# Patient Record
Sex: Female | Born: 1975 | Race: Black or African American | Hispanic: No | Marital: Married | Smoking: Never smoker
Health system: Southern US, Community
[De-identification: ages and names within clinical notes are randomized; demographics above are authoritative.]

## PROBLEM LIST (undated history)

## (undated) DIAGNOSIS — L0591 Pilonidal cyst without abscess: Secondary | ICD-10-CM

## (undated) DIAGNOSIS — M1990 Unspecified osteoarthritis, unspecified site: Secondary | ICD-10-CM

## (undated) DIAGNOSIS — R896 Abnormal cytological findings in specimens from other organs, systems and tissues: Secondary | ICD-10-CM

## (undated) DIAGNOSIS — R87629 Unspecified abnormal cytological findings in specimens from vagina: Secondary | ICD-10-CM

## (undated) DIAGNOSIS — O119 Pre-existing hypertension with pre-eclampsia, unspecified trimester: Secondary | ICD-10-CM

## (undated) DIAGNOSIS — B009 Herpesviral infection, unspecified: Secondary | ICD-10-CM

## (undated) DIAGNOSIS — O09299 Supervision of pregnancy with other poor reproductive or obstetric history, unspecified trimester: Secondary | ICD-10-CM

## (undated) DIAGNOSIS — O1503 Eclampsia in pregnancy, third trimester: Secondary | ICD-10-CM

## (undated) DIAGNOSIS — N841 Polyp of cervix uteri: Secondary | ICD-10-CM

## (undated) DIAGNOSIS — IMO0001 Reserved for inherently not codable concepts without codable children: Secondary | ICD-10-CM

## (undated) DIAGNOSIS — I10 Essential (primary) hypertension: Secondary | ICD-10-CM

## (undated) HISTORY — PX: WISDOM TOOTH EXTRACTION: SHX21

## (undated) HISTORY — DX: Polyp of cervix uteri: N84.1

## (undated) HISTORY — DX: Herpesviral infection, unspecified: B00.9

## (undated) HISTORY — PX: HIP SURGERY: SHX245

## (undated) HISTORY — DX: Reserved for inherently not codable concepts without codable children: IMO0001

## (undated) HISTORY — DX: Abnormal cytological findings in specimens from other organs, systems and tissues: R89.6

---

## 2002-08-01 ENCOUNTER — Other Ambulatory Visit: Admission: RE | Admit: 2002-08-01 | Discharge: 2002-08-01 | Payer: Self-pay | Admitting: Obstetrics and Gynecology

## 2003-01-06 ENCOUNTER — Emergency Department (HOSPITAL_COMMUNITY): Admission: EM | Admit: 2003-01-06 | Discharge: 2003-01-06 | Payer: Self-pay | Admitting: Emergency Medicine

## 2003-06-25 ENCOUNTER — Emergency Department (HOSPITAL_COMMUNITY): Admission: EM | Admit: 2003-06-25 | Discharge: 2003-06-25 | Payer: Self-pay | Admitting: Emergency Medicine

## 2004-03-07 ENCOUNTER — Other Ambulatory Visit: Admission: RE | Admit: 2004-03-07 | Discharge: 2004-03-07 | Payer: Self-pay | Admitting: Obstetrics and Gynecology

## 2004-04-02 ENCOUNTER — Ambulatory Visit: Payer: Self-pay | Admitting: Family Medicine

## 2004-04-03 ENCOUNTER — Ambulatory Visit: Payer: Self-pay | Admitting: *Deleted

## 2005-08-06 ENCOUNTER — Other Ambulatory Visit: Admission: RE | Admit: 2005-08-06 | Discharge: 2005-08-06 | Payer: Self-pay | Admitting: Obstetrics and Gynecology

## 2011-05-01 ENCOUNTER — Encounter (INDEPENDENT_AMBULATORY_CARE_PROVIDER_SITE_OTHER): Payer: Federal, State, Local not specified - PPO | Admitting: Obstetrics and Gynecology

## 2011-05-01 DIAGNOSIS — R87610 Atypical squamous cells of undetermined significance on cytologic smear of cervix (ASC-US): Secondary | ICD-10-CM

## 2012-02-16 DIAGNOSIS — B009 Herpesviral infection, unspecified: Secondary | ICD-10-CM | POA: Insufficient documentation

## 2012-02-16 DIAGNOSIS — N841 Polyp of cervix uteri: Secondary | ICD-10-CM | POA: Insufficient documentation

## 2012-02-16 DIAGNOSIS — IMO0001 Reserved for inherently not codable concepts without codable children: Secondary | ICD-10-CM | POA: Insufficient documentation

## 2012-02-19 ENCOUNTER — Ambulatory Visit (INDEPENDENT_AMBULATORY_CARE_PROVIDER_SITE_OTHER): Payer: Federal, State, Local not specified - PPO | Admitting: Obstetrics and Gynecology

## 2012-02-19 ENCOUNTER — Encounter: Payer: Self-pay | Admitting: Obstetrics and Gynecology

## 2012-02-19 VITALS — BP 134/80 | HR 82 | Wt 260.0 lb

## 2012-02-19 DIAGNOSIS — Z309 Encounter for contraceptive management, unspecified: Secondary | ICD-10-CM

## 2012-02-19 DIAGNOSIS — Z3046 Encounter for surveillance of implantable subdermal contraceptive: Secondary | ICD-10-CM

## 2012-02-19 DIAGNOSIS — IMO0001 Reserved for inherently not codable concepts without codable children: Secondary | ICD-10-CM

## 2012-02-19 DIAGNOSIS — Z30017 Encounter for initial prescription of implantable subdermal contraceptive: Secondary | ICD-10-CM

## 2012-02-19 LAB — POCT URINE PREGNANCY: Preg Test, Ur: NEGATIVE

## 2012-02-19 MED ORDER — ETONOGESTREL 68 MG ~~LOC~~ IMPL
68.0000 mg | DRUG_IMPLANT | Freq: Once | SUBCUTANEOUS | Status: AC
  Administered 2012-02-19: 68 mg via SUBCUTANEOUS

## 2012-02-19 NOTE — Addendum Note (Signed)
Addended byWinfred Leeds on: 02/19/2012 03:34 PM   Modules accepted: Orders

## 2012-02-19 NOTE — Progress Notes (Signed)
36 YO for Nexplanon removal and reinsertion.  Consent signed.  O:Right Arm:  Implant that was removed without difficulty-per protocol had been placed 3 cm above the medial elbow,  thus, Nexplanon placed in same space    without difficulty, incision closed with steri-strips and Benzoin and dressed with sterile band-aid and sterile 4 x 4 pressure bandage with Kling  UPT-negative  A:  Nexplanon Removal and reinsertion  P: Call [facility] [phone]:  -for temperature of 100.4 degrees Fahrenheit or more -pain not improved with over the counter pain medications (Ibuprofen, Advil, Aleve,     Tylenol or acetaminophen) -for excessive bleeding from insertion site -for excessive swelling redness or green drainage from your insertion site -for any other concerns -keep insertion site clean, dry and covered  for 24 hours -you may remove pressure bandage in 1-4 hours  RTO-1 week for follow up or prn  Matisyn Cabeza, PA-C

## 2012-02-19 NOTE — Patient Instructions (Signed)
Call [facility] [phone]:  -for temperature of 100.4 degrees Fahrenheit or more -pain not improved with over the counter pain medications (Ibuprofen, Advil, Aleve,     Tylenol or acetaminophen) -for excessive bleeding from insertion site -for excessive swelling redness or green drainage from your insertion site -for any other concerns -keep insertion site clean, dry and covered  for 24 hours -you may remove pressure bandage in 1-4 hours

## 2012-04-29 ENCOUNTER — Ambulatory Visit: Payer: Federal, State, Local not specified - PPO | Admitting: Obstetrics and Gynecology

## 2013-12-18 ENCOUNTER — Encounter: Payer: Self-pay | Admitting: Obstetrics and Gynecology

## 2015-07-29 DIAGNOSIS — R319 Hematuria, unspecified: Secondary | ICD-10-CM | POA: Insufficient documentation

## 2015-08-23 DIAGNOSIS — R319 Hematuria, unspecified: Secondary | ICD-10-CM | POA: Diagnosis not present

## 2015-09-04 DIAGNOSIS — K080 Exfoliation of teeth due to systemic causes: Secondary | ICD-10-CM | POA: Diagnosis not present

## 2015-09-09 DIAGNOSIS — R32 Unspecified urinary incontinence: Secondary | ICD-10-CM | POA: Diagnosis not present

## 2015-09-09 DIAGNOSIS — R3129 Other microscopic hematuria: Secondary | ICD-10-CM | POA: Diagnosis not present

## 2016-04-06 DIAGNOSIS — J309 Allergic rhinitis, unspecified: Secondary | ICD-10-CM | POA: Diagnosis not present

## 2016-04-06 DIAGNOSIS — J301 Allergic rhinitis due to pollen: Secondary | ICD-10-CM | POA: Diagnosis not present

## 2016-04-06 DIAGNOSIS — R21 Rash and other nonspecific skin eruption: Secondary | ICD-10-CM | POA: Diagnosis not present

## 2016-04-06 DIAGNOSIS — H1045 Other chronic allergic conjunctivitis: Secondary | ICD-10-CM | POA: Diagnosis not present

## 2016-04-08 DIAGNOSIS — J302 Other seasonal allergic rhinitis: Secondary | ICD-10-CM | POA: Insufficient documentation

## 2016-04-09 DIAGNOSIS — K080 Exfoliation of teeth due to systemic causes: Secondary | ICD-10-CM | POA: Diagnosis not present

## 2016-04-09 DIAGNOSIS — J301 Allergic rhinitis due to pollen: Secondary | ICD-10-CM | POA: Diagnosis not present

## 2016-04-10 DIAGNOSIS — L219 Seborrheic dermatitis, unspecified: Secondary | ICD-10-CM | POA: Diagnosis not present

## 2016-04-10 DIAGNOSIS — L308 Other specified dermatitis: Secondary | ICD-10-CM | POA: Diagnosis not present

## 2016-04-17 DIAGNOSIS — I10 Essential (primary) hypertension: Secondary | ICD-10-CM | POA: Diagnosis not present

## 2016-04-17 DIAGNOSIS — L309 Dermatitis, unspecified: Secondary | ICD-10-CM | POA: Insufficient documentation

## 2016-06-15 DIAGNOSIS — Z3040 Encounter for surveillance of contraceptives, unspecified: Secondary | ICD-10-CM | POA: Diagnosis not present

## 2016-06-18 DIAGNOSIS — Z3A01 Less than 8 weeks gestation of pregnancy: Secondary | ICD-10-CM | POA: Diagnosis not present

## 2016-06-18 DIAGNOSIS — O26859 Spotting complicating pregnancy, unspecified trimester: Secondary | ICD-10-CM | POA: Diagnosis not present

## 2016-06-18 DIAGNOSIS — O26851 Spotting complicating pregnancy, first trimester: Secondary | ICD-10-CM | POA: Diagnosis not present

## 2016-07-15 DIAGNOSIS — O161 Unspecified maternal hypertension, first trimester: Secondary | ICD-10-CM | POA: Diagnosis not present

## 2016-07-15 DIAGNOSIS — O208 Other hemorrhage in early pregnancy: Secondary | ICD-10-CM | POA: Diagnosis not present

## 2016-07-15 DIAGNOSIS — Z6841 Body Mass Index (BMI) 40.0 and over, adult: Secondary | ICD-10-CM | POA: Diagnosis not present

## 2016-07-15 DIAGNOSIS — Z3A08 8 weeks gestation of pregnancy: Secondary | ICD-10-CM | POA: Diagnosis not present

## 2016-07-24 DIAGNOSIS — Z3A10 10 weeks gestation of pregnancy: Secondary | ICD-10-CM | POA: Diagnosis not present

## 2016-07-24 DIAGNOSIS — O09521 Supervision of elderly multigravida, first trimester: Secondary | ICD-10-CM | POA: Diagnosis not present

## 2016-07-24 DIAGNOSIS — O161 Unspecified maternal hypertension, first trimester: Secondary | ICD-10-CM | POA: Diagnosis not present

## 2016-07-24 LAB — OB RESULTS CONSOLE ABO/RH: RH Type: POSITIVE

## 2016-07-24 LAB — OB RESULTS CONSOLE RUBELLA ANTIBODY, IGM: Rubella: IMMUNE

## 2016-07-24 LAB — OB RESULTS CONSOLE GC/CHLAMYDIA
Chlamydia: NEGATIVE
Gonorrhea: NEGATIVE

## 2016-07-24 LAB — OB RESULTS CONSOLE HIV ANTIBODY (ROUTINE TESTING): HIV: NONREACTIVE

## 2016-07-24 LAB — OB RESULTS CONSOLE HEPATITIS B SURFACE ANTIGEN: Hepatitis B Surface Ag: NEGATIVE

## 2016-07-24 LAB — OB RESULTS CONSOLE RPR: RPR: NONREACTIVE

## 2016-07-24 LAB — OB RESULTS CONSOLE ANTIBODY SCREEN: Antibody Screen: NEGATIVE

## 2016-07-31 DIAGNOSIS — Z113 Encounter for screening for infections with a predominantly sexual mode of transmission: Secondary | ICD-10-CM | POA: Diagnosis not present

## 2016-07-31 DIAGNOSIS — Z3689 Encounter for other specified antenatal screening: Secondary | ICD-10-CM | POA: Diagnosis not present

## 2016-07-31 DIAGNOSIS — R87610 Atypical squamous cells of undetermined significance on cytologic smear of cervix (ASC-US): Secondary | ICD-10-CM | POA: Diagnosis not present

## 2016-07-31 DIAGNOSIS — O09521 Supervision of elderly multigravida, first trimester: Secondary | ICD-10-CM | POA: Diagnosis not present

## 2016-07-31 DIAGNOSIS — Z3A11 11 weeks gestation of pregnancy: Secondary | ICD-10-CM | POA: Diagnosis not present

## 2016-07-31 DIAGNOSIS — Z36 Encounter for antenatal screening for chromosomal anomalies: Secondary | ICD-10-CM | POA: Diagnosis not present

## 2016-08-18 DIAGNOSIS — O09521 Supervision of elderly multigravida, first trimester: Secondary | ICD-10-CM | POA: Diagnosis not present

## 2016-08-18 DIAGNOSIS — Z3A13 13 weeks gestation of pregnancy: Secondary | ICD-10-CM | POA: Diagnosis not present

## 2016-09-04 DIAGNOSIS — O09522 Supervision of elderly multigravida, second trimester: Secondary | ICD-10-CM | POA: Diagnosis not present

## 2016-09-04 DIAGNOSIS — Z3A16 16 weeks gestation of pregnancy: Secondary | ICD-10-CM | POA: Diagnosis not present

## 2016-09-04 DIAGNOSIS — Z361 Encounter for antenatal screening for raised alphafetoprotein level: Secondary | ICD-10-CM | POA: Diagnosis not present

## 2016-09-28 DIAGNOSIS — O09522 Supervision of elderly multigravida, second trimester: Secondary | ICD-10-CM | POA: Diagnosis not present

## 2016-09-28 DIAGNOSIS — Z3A19 19 weeks gestation of pregnancy: Secondary | ICD-10-CM | POA: Diagnosis not present

## 2016-09-28 DIAGNOSIS — Z363 Encounter for antenatal screening for malformations: Secondary | ICD-10-CM | POA: Diagnosis not present

## 2016-10-23 DIAGNOSIS — Z362 Encounter for other antenatal screening follow-up: Secondary | ICD-10-CM | POA: Diagnosis not present

## 2016-10-23 DIAGNOSIS — O09522 Supervision of elderly multigravida, second trimester: Secondary | ICD-10-CM | POA: Diagnosis not present

## 2016-10-23 DIAGNOSIS — Z3A23 23 weeks gestation of pregnancy: Secondary | ICD-10-CM | POA: Diagnosis not present

## 2016-11-02 DIAGNOSIS — O09522 Supervision of elderly multigravida, second trimester: Secondary | ICD-10-CM | POA: Diagnosis not present

## 2016-11-02 DIAGNOSIS — L0591 Pilonidal cyst without abscess: Secondary | ICD-10-CM | POA: Diagnosis not present

## 2016-11-02 DIAGNOSIS — Z3A24 24 weeks gestation of pregnancy: Secondary | ICD-10-CM | POA: Diagnosis not present

## 2016-11-12 DIAGNOSIS — K080 Exfoliation of teeth due to systemic causes: Secondary | ICD-10-CM | POA: Diagnosis not present

## 2016-11-16 DIAGNOSIS — O283 Abnormal ultrasonic finding on antenatal screening of mother: Secondary | ICD-10-CM | POA: Diagnosis not present

## 2016-11-16 DIAGNOSIS — Z3A26 26 weeks gestation of pregnancy: Secondary | ICD-10-CM | POA: Diagnosis not present

## 2016-11-16 DIAGNOSIS — O10012 Pre-existing essential hypertension complicating pregnancy, second trimester: Secondary | ICD-10-CM | POA: Diagnosis not present

## 2016-11-23 DIAGNOSIS — O132 Gestational [pregnancy-induced] hypertension without significant proteinuria, second trimester: Secondary | ICD-10-CM | POA: Diagnosis not present

## 2016-11-23 DIAGNOSIS — Z3A27 27 weeks gestation of pregnancy: Secondary | ICD-10-CM | POA: Diagnosis not present

## 2016-11-23 DIAGNOSIS — Z3689 Encounter for other specified antenatal screening: Secondary | ICD-10-CM | POA: Diagnosis not present

## 2016-11-30 DIAGNOSIS — Z3689 Encounter for other specified antenatal screening: Secondary | ICD-10-CM | POA: Diagnosis not present

## 2016-11-30 DIAGNOSIS — Z3A28 28 weeks gestation of pregnancy: Secondary | ICD-10-CM | POA: Diagnosis not present

## 2016-11-30 DIAGNOSIS — O132 Gestational [pregnancy-induced] hypertension without significant proteinuria, second trimester: Secondary | ICD-10-CM | POA: Diagnosis not present

## 2016-11-30 DIAGNOSIS — O4443 Low lying placenta NOS or without hemorrhage, third trimester: Secondary | ICD-10-CM | POA: Diagnosis not present

## 2016-11-30 DIAGNOSIS — O10013 Pre-existing essential hypertension complicating pregnancy, third trimester: Secondary | ICD-10-CM | POA: Diagnosis not present

## 2016-12-14 DIAGNOSIS — Z23 Encounter for immunization: Secondary | ICD-10-CM | POA: Diagnosis not present

## 2016-12-14 DIAGNOSIS — Z3A30 30 weeks gestation of pregnancy: Secondary | ICD-10-CM | POA: Diagnosis not present

## 2016-12-14 DIAGNOSIS — O10013 Pre-existing essential hypertension complicating pregnancy, third trimester: Secondary | ICD-10-CM | POA: Diagnosis not present

## 2016-12-17 DIAGNOSIS — L0501 Pilonidal cyst with abscess: Secondary | ICD-10-CM | POA: Diagnosis not present

## 2016-12-28 DIAGNOSIS — O10013 Pre-existing essential hypertension complicating pregnancy, third trimester: Secondary | ICD-10-CM | POA: Diagnosis not present

## 2016-12-28 DIAGNOSIS — Z3A32 32 weeks gestation of pregnancy: Secondary | ICD-10-CM | POA: Diagnosis not present

## 2017-01-11 DIAGNOSIS — Z3A34 34 weeks gestation of pregnancy: Secondary | ICD-10-CM | POA: Diagnosis not present

## 2017-01-11 DIAGNOSIS — O10013 Pre-existing essential hypertension complicating pregnancy, third trimester: Secondary | ICD-10-CM | POA: Diagnosis not present

## 2017-01-14 ENCOUNTER — Other Ambulatory Visit: Payer: Self-pay | Admitting: Obstetrics and Gynecology

## 2017-01-19 DIAGNOSIS — Z3A35 35 weeks gestation of pregnancy: Secondary | ICD-10-CM | POA: Diagnosis not present

## 2017-01-19 DIAGNOSIS — Z3685 Encounter for antenatal screening for Streptococcus B: Secondary | ICD-10-CM | POA: Diagnosis not present

## 2017-01-19 DIAGNOSIS — O10013 Pre-existing essential hypertension complicating pregnancy, third trimester: Secondary | ICD-10-CM | POA: Diagnosis not present

## 2017-01-26 ENCOUNTER — Encounter (HOSPITAL_COMMUNITY): Payer: Self-pay

## 2017-01-26 ENCOUNTER — Other Ambulatory Visit: Payer: Self-pay

## 2017-01-26 ENCOUNTER — Inpatient Hospital Stay (HOSPITAL_COMMUNITY)
Admission: AD | Admit: 2017-01-26 | Discharge: 2017-01-26 | Disposition: A | Discharge status: Home / Self Care | Payer: Federal, State, Local not specified - PPO | Source: Ambulatory Visit | Attending: Obstetrics and Gynecology | Admitting: Obstetrics and Gynecology

## 2017-01-26 DIAGNOSIS — Z3A36 36 weeks gestation of pregnancy: Secondary | ICD-10-CM | POA: Diagnosis not present

## 2017-01-26 DIAGNOSIS — O10913 Unspecified pre-existing hypertension complicating pregnancy, third trimester: Secondary | ICD-10-CM

## 2017-01-26 DIAGNOSIS — O10013 Pre-existing essential hypertension complicating pregnancy, third trimester: Secondary | ICD-10-CM | POA: Insufficient documentation

## 2017-01-26 HISTORY — DX: Unspecified osteoarthritis, unspecified site: M19.90

## 2017-01-26 HISTORY — DX: Essential (primary) hypertension: I10

## 2017-01-26 LAB — COMPREHENSIVE METABOLIC PANEL
ALT: 17 U/L (ref 14–54)
AST: 19 U/L (ref 15–41)
Albumin: 2.9 g/dL — ABNORMAL LOW (ref 3.5–5.0)
Alkaline Phosphatase: 88 U/L (ref 38–126)
Anion gap: 10 (ref 5–15)
BUN: 8 mg/dL (ref 6–20)
CO2: 18 mmol/L — ABNORMAL LOW (ref 22–32)
Calcium: 9 mg/dL (ref 8.9–10.3)
Chloride: 107 mmol/L (ref 101–111)
Creatinine, Ser: 0.8 mg/dL (ref 0.44–1.00)
GFR calc Af Amer: >60 mL/min (ref >60)
GFR calc non Af Amer: >60 mL/min (ref >60)
Glucose, Bld: 82 mg/dL (ref 65–99)
Potassium: 4.1 mmol/L (ref 3.5–5.1)
Sodium: 135 mmol/L (ref 135–145)
Total Bilirubin: 0.4 mg/dL (ref 0.3–1.2)
Total Protein: 6.9 g/dL (ref 6.5–8.1)

## 2017-01-26 LAB — PROTEIN / CREATININE RATIO, URINE
Creatinine, Urine: 116 mg/dL
Protein Creatinine Ratio: 0.11 mg/mg{Cre} (ref 0.00–0.15)
Total Protein, Urine: 13 mg/dL

## 2017-01-26 LAB — URINALYSIS, ROUTINE W REFLEX MICROSCOPIC
Bilirubin Urine: NEGATIVE
Glucose, UA: NEGATIVE mg/dL
Hgb urine dipstick: NEGATIVE
Ketones, ur: NEGATIVE mg/dL
Leukocytes, UA: NEGATIVE
Nitrite: NEGATIVE
Protein, ur: NEGATIVE mg/dL
Specific Gravity, Urine: 1.011 (ref 1.005–1.030)
pH: 5 (ref 5.0–8.0)

## 2017-01-26 LAB — CBC
HCT: 34.6 % — ABNORMAL LOW (ref 36.0–46.0)
Hemoglobin: 11.1 g/dL — ABNORMAL LOW (ref 12.0–15.0)
MCH: 26.4 pg (ref 26.0–34.0)
MCHC: 32.1 g/dL (ref 30.0–36.0)
MCV: 82.2 fL (ref 78.0–100.0)
Platelets: 257 10*3/uL (ref 150–400)
RBC: 4.21 MIL/uL (ref 3.87–5.11)
RDW: 14.9 % (ref 11.5–15.5)
WBC: 5.8 10*3/uL (ref 4.0–10.5)

## 2017-01-26 LAB — URIC ACID: Uric Acid, Serum: 7.1 mg/dL — ABNORMAL HIGH (ref 2.3–6.6)

## 2017-01-26 MED ORDER — LABETALOL HCL 200 MG PO TABS: 300.0000 mg | ORAL_TABLET | Freq: Two times a day (BID) | ORAL | 8 refills | Status: DC

## 2017-01-26 NOTE — MAU Note (Signed)
Pt left before discharge paper were printed. Called pt verified DOB, reviewed D/C instructions. Pt is to follow up with regular scheduled OB appointment. Pt VS were taken every 15 min, blood pressure within normal limits.

## 2017-01-26 NOTE — MAU Note (Signed)
Pt had one severe range blood pressure of 164/74, repeated at 15 min and blood pressure was 149/79. Continuing to monitor blood pressures for severe range.

## 2017-01-26 NOTE — MAU Provider Note (Signed)
History     Chief Complaint  Patient presents with  . Hypertension   41 yo G2P0010 BF with known chronic HTN on labetalol now @ 36 4/[redacted] weeks gestation sent from the office for [facility] labs due to elevated BP in office. Pt denies h/a, visual changes or epigastric pain  OB History    Gravida Para Term Preterm AB Living   2 0     1 0   SAB TAB Ectopic Multiple Live Births     1            Past Medical History:  Diagnosis Date  . ASCUS (atypical squamous cells of undetermined significance) on Pap smear   . Endocervical polyp   . HSV-2 (herpes simplex virus 2) infection   . Hypertension   . Osteoarthritis     Past Surgical History:  Procedure Laterality Date  . HIP SURGERY    . WISDOM TOOTH EXTRACTION      Family History  Problem Relation Age of Onset  . Hypertension Paternal Grandfather   . Diabetes Paternal Grandfather   . Hypertension Paternal Grandmother   . Diabetes Paternal Grandmother   . Hypertension Maternal Grandmother   . Diabetes Maternal Grandmother   . Hypertension Maternal Grandfather   . Diabetes Maternal Grandfather     Social History   Tobacco Use  . Smoking status: Never Smoker  . Smokeless tobacco: Never Used  Substance Use Topics  . Alcohol use: Yes  . Drug use: No    Allergies: No Known Allergies  Medications Prior to Admission  Medication Sig Dispense Refill Last Dose  . labetalol (NORMODYNE) 200 MG tablet Take 1 tablet by mouth 2 (two) times daily.   01/26/2017 at 0700  . Prenatal MV & Min w/FA-DHA (PRENATAL ADULT GUMMY/DHA/FA) 0.4-25 MG CHEW Chew 2 tablets by mouth daily.   01/25/2017 at Unknown time  . ranitidine (ZANTAC) 150 MG tablet Take 150 mg by mouth 2 (two) times daily.   01/25/2017 at Unknown time  . acyclovir (ZOVIRAX) 200 MG capsule Take by mouth every 4 (four) hours while awake.   Taking  . Diaper Rash Products (DESITIN) OINT Apply topically.   Taking  . ketoconazole (NIZORAL) 2 % shampoo Apply topically 2 (two) times a week.    Taking  . pimecrolimus (ELIDEL) 1 % cream Apply topically 2 (two) times daily.   Taking     Physical Exam   Blood pressure 131/79, pulse (!) 58, temperature (!) 97.5 F (36.4 C), temperature source Oral, resp. rate 18, SpO2 100 %.  No exam performed today, done in office. Tracing reactive baseline 130 (+) accel 150 no ctx  IMP: chronic HTN in third trimester R/o superimposed preeclampsia vs worsening BP P) [facility] labs   ED Course  Addendum: CBC    Component Value Date/Time   WBC 5.8 01/26/2017 1350   RBC 4.21 01/26/2017 1350   HGB 11.1 (L) 01/26/2017 1350   HCT 34.6 (L) 01/26/2017 1350   PLT 257 01/26/2017 1350   MCV 82.2 01/26/2017 1350   MCH 26.4 01/26/2017 1350   MCHC 32.1 01/26/2017 1350   RDW 14.9 01/26/2017 1350   CMP Latest Ref Rng & Units 01/26/2017  Glucose 65 - 99 mg/dL 82  BUN 6 - 20 mg/dL 8  Creatinine 0.44 - 1.00 mg/dL 0.80  Sodium 135 - 145 mmol/L 135  Potassium 3.5 - 5.1 mmol/L 4.1  Chloride 101 - 111 mmol/L 107  CO2 22 - 32 mmol/L 18(L)  Calcium  8.9 - 10.3 mg/dL 9.0  Total Protein 6.5 - 8.1 g/dL 6.9  Total Bilirubin 0.3 - 1.2 mg/dL 0.4  Alkaline Phos 38 - 126 U/L 88  AST 15 - 41 U/L 19  ALT 14 - 54 U/L 17  urine protein/creatinine 0.11  IMP: no evidence of preeclampsia P) d/c home. Increase labetalol to 300mg  po bid F/u one week Preeclampsia warning signs reviewed MDM   Marvene Staff, MD 2:02 PM 01/26/2017

## 2017-01-26 NOTE — MAU Note (Signed)
Pt presents to MAU from OB office due to elevated blood pressures. Here for [facility] workup. Pt denies abdominal pain and vaginal bleeding/discharge. +FM

## 2017-01-26 NOTE — Discharge Instructions (Signed)
Fetal Movement Counts °Patient Name: ________________________________________________ Patient Due Date: ____________________ °What is a fetal movement count? °A fetal movement count is the number of times that you feel your baby move during a certain amount of time. This may also be called a fetal kick count. A fetal movement count is recommended for every pregnant woman. You may be asked to start counting fetal movements as early as week 28 of your pregnancy. °Pay attention to when your baby is most active. You may notice your baby's sleep and wake cycles. You may also notice things that make your baby move more. You should do a fetal movement count: °· When your baby is normally most active. °· At the same time each day. ° °A good time to count movements is while you are resting, after having something to eat and drink. °How do I count fetal movements? °1. Find a quiet, comfortable area. Sit, or lie down on your side. °2. Write down the date, the start time and stop time, and the number of movements that you felt between those two times. Take this information with you to your health care visits. °3. For 2 hours, count kicks, flutters, swishes, rolls, and jabs. You should feel at least 10 movements during 2 hours. °4. You may stop counting after you have felt 10 movements. °5. If you do not feel 10 movements in 2 hours, have something to eat and drink. Then, keep resting and counting for 1 hour. If you feel at least 4 movements during that hour, you may stop counting. °Contact a health care provider if: °· You feel fewer than 4 movements in 2 hours. °· Your baby is not moving like he or she usually does. °Date: ____________ Start time: ____________ Stop time: ____________ Movements: ____________ °Date: ____________ Start time: ____________ Stop time: ____________ Movements: ____________ °Date: ____________ Start time: ____________ Stop time: ____________ Movements: ____________ °Date: ____________ Start time:  ____________ Stop time: ____________ Movements: ____________ °Date: ____________ Start time: ____________ Stop time: ____________ Movements: ____________ °Date: ____________ Start time: ____________ Stop time: ____________ Movements: ____________ °Date: ____________ Start time: ____________ Stop time: ____________ Movements: ____________ °Date: ____________ Start time: ____________ Stop time: ____________ Movements: ____________ °Date: ____________ Start time: ____________ Stop time: ____________ Movements: ____________ °This information is not intended to replace advice given to you by your health care provider. Make sure you discuss any questions you have with your health care provider. °Document Released: 03/04/2006 Document Revised: 10/02/2015 Document Reviewed: 03/14/2015 °Elsevier Interactive Patient Education © 2018 [employer] °Braxton Hicks Contractions °Contractions of the uterus can occur throughout pregnancy, but they are not always a sign that you are in labor. You may have practice contractions called Braxton Hicks contractions. These false labor contractions are sometimes confused with true labor. °What are Braxton Hicks contractions? °Braxton Hicks contractions are tightening movements that occur in the muscles of the uterus before labor. Unlike true labor contractions, these contractions do not result in opening (dilation) and thinning of the cervix. Toward the end of pregnancy (32-34 weeks), Braxton Hicks contractions can happen more often and may become stronger. These contractions are sometimes difficult to tell apart from true labor because they can be very uncomfortable. You should not feel embarrassed if you go to the hospital with false labor. °Sometimes, the only way to tell if you are in true labor is for your health care provider to look for changes in the cervix. The health care provider will do a physical exam and may monitor your contractions. If   you are not in true labor, the exam  should show that your cervix is not dilating and your water has not broken. °If there are no prenatal problems or other health problems associated with your pregnancy, it is completely safe for you to be sent home with false labor. You may continue to have Braxton Hicks contractions until you go into true labor. °How can I tell the difference between true labor and false labor? °· Differences °? False labor °? Contractions last 30-70 seconds.: Contractions are usually shorter and not as strong as true labor contractions. °? Contractions become very regular.: Contractions are usually irregular. °? Discomfort is usually felt in the top of the uterus, and it spreads to the lower abdomen and low back.: Contractions are often felt in the front of the lower abdomen and in the groin. °? Contractions do not go away with walking.: Contractions may go away when you walk around or change positions while lying down. °? Contractions usually become more intense and increase in frequency.: Contractions get weaker and are shorter-lasting as time goes on. °? The cervix dilates and gets thinner.: The cervix usually does not dilate or become thin. °Follow these instructions at home: °· Take over-the-counter and prescription medicines only as told by your health care provider. °· Keep up with your usual exercises and follow other instructions from your health care provider. °· Eat and drink lightly if you think you are going into labor. °· If Braxton Hicks contractions are making you uncomfortable: °? Change your position from lying down or resting to walking, or change from walking to resting. °? Sit and rest in a tub of warm water. °? Drink enough fluid to keep your urine clear or pale yellow. Dehydration may cause these contractions. °? Do slow and deep breathing several times an hour. °· Keep all follow-up prenatal visits as told by your health care provider. This is important. °Contact a health care provider if: °· You have a  fever. °· You have continuous pain in your abdomen. °Get help right away if: °· Your contractions become stronger, more regular, and closer together. °· You have fluid leaking or gushing from your vagina. °· You pass blood-tinged mucus (bloody show). °· You have bleeding from your vagina. °· You have low back pain that you never had before. °· You feel your baby’s head pushing down and causing pelvic pressure. °· Your baby is not moving inside you as much as it used to. °Summary °· Contractions that occur before labor are called Braxton Hicks contractions, false labor, or practice contractions. °· Braxton Hicks contractions are usually shorter, weaker, farther apart, and less regular than true labor contractions. True labor contractions usually become progressively stronger and regular and they become more frequent. °· Manage discomfort from Braxton Hicks contractions by changing position, resting in a warm bath, drinking plenty of water, or practicing deep breathing. °This information is not intended to replace advice given to you by your health care provider. Make sure you discuss any questions you have with your health care provider. °Document Released: 02/02/2005 Document Revised: 12/23/2015 Document Reviewed: 12/23/2015 °Elsevier Interactive Patient Education © 2017 Elsevier Inc. ° °

## 2017-01-27 ENCOUNTER — Encounter (HOSPITAL_COMMUNITY): Payer: Self-pay | Admitting: *Deleted

## 2017-01-28 ENCOUNTER — Encounter (HOSPITAL_COMMUNITY): Payer: Self-pay

## 2017-02-05 DIAGNOSIS — Z3A38 38 weeks gestation of pregnancy: Secondary | ICD-10-CM | POA: Diagnosis not present

## 2017-02-05 DIAGNOSIS — O133 Gestational [pregnancy-induced] hypertension without significant proteinuria, third trimester: Secondary | ICD-10-CM | POA: Diagnosis not present

## 2017-02-05 DIAGNOSIS — O288 Other abnormal findings on antenatal screening of mother: Secondary | ICD-10-CM | POA: Diagnosis not present

## 2017-02-10 DIAGNOSIS — Z3A38 38 weeks gestation of pregnancy: Secondary | ICD-10-CM | POA: Diagnosis not present

## 2017-02-10 DIAGNOSIS — O288 Other abnormal findings on antenatal screening of mother: Secondary | ICD-10-CM | POA: Diagnosis not present

## 2017-02-10 DIAGNOSIS — O133 Gestational [pregnancy-induced] hypertension without significant proteinuria, third trimester: Secondary | ICD-10-CM | POA: Diagnosis not present

## 2017-02-10 DIAGNOSIS — Z01818 Encounter for other preprocedural examination: Secondary | ICD-10-CM | POA: Diagnosis not present

## 2017-02-10 NOTE — Patient Instructions (Signed)
Kylie Robertson  02/10/2017   Your procedure is scheduled on:  02/12/2018  Enter through the Main Entrance of [facility] [place] up the phone at the desk and dial [phone]  Call this number if you have problems the morning of surgery:[phone]  Remember:   Do not eat food:After Midnight.  Do not drink clear liquids: After Midnight.  Take these medicines the morning of surgery with A SIP OF WATER: take your labetalol and zantac as usual   Do not wear jewelry, make-up or nail polish.  Do not wear lotions, powders, or perfumes. Do not wear deodorant.  Do not shave 48 hours prior to surgery.  Do not bring valuables to the hospital.  [facility] is not   responsible for any belongings or valuables brought to the hospital.  Contacts, dentures or bridgework may not be worn into surgery.  Leave suitcase in the car. After surgery it may be brought to your room.  For patients admitted to the hospital, checkout time is 11:00 AM the day of              discharge.    N/A   Please read over the following fact sheets that you were given:   Surgical Site Infection Prevention

## 2017-02-11 ENCOUNTER — Encounter (HOSPITAL_COMMUNITY)
Admission: RE | Admit: 2017-02-11 | Discharge: 2017-02-11 | Disposition: A | Discharge status: Home / Self Care | Payer: Federal, State, Local not specified - PPO | Source: Ambulatory Visit | Attending: Obstetrics and Gynecology | Admitting: Obstetrics and Gynecology

## 2017-02-11 DIAGNOSIS — O9902 Anemia complicating childbirth: Secondary | ICD-10-CM | POA: Diagnosis not present

## 2017-02-11 DIAGNOSIS — O9962 Diseases of the digestive system complicating childbirth: Secondary | ICD-10-CM | POA: Diagnosis not present

## 2017-02-11 DIAGNOSIS — O3413 Maternal care for benign tumor of corpus uteri, third trimester: Secondary | ICD-10-CM | POA: Diagnosis not present

## 2017-02-11 DIAGNOSIS — O1002 Pre-existing essential hypertension complicating childbirth: Secondary | ICD-10-CM | POA: Diagnosis not present

## 2017-02-11 DIAGNOSIS — O1003 Pre-existing essential hypertension complicating the puerperium: Secondary | ICD-10-CM | POA: Diagnosis not present

## 2017-02-11 DIAGNOSIS — O115 Pre-existing hypertension with pre-eclampsia, complicating the puerperium: Secondary | ICD-10-CM | POA: Diagnosis not present

## 2017-02-11 DIAGNOSIS — D252 Subserosal leiomyoma of uterus: Secondary | ICD-10-CM | POA: Diagnosis not present

## 2017-02-11 DIAGNOSIS — O9832 Other infections with a predominantly sexual mode of transmission complicating childbirth: Secondary | ICD-10-CM | POA: Diagnosis not present

## 2017-02-11 DIAGNOSIS — Z3A38 38 weeks gestation of pregnancy: Secondary | ICD-10-CM | POA: Diagnosis not present

## 2017-02-11 DIAGNOSIS — A6000 Herpesviral infection of urogenital system, unspecified: Secondary | ICD-10-CM | POA: Diagnosis not present

## 2017-02-11 DIAGNOSIS — O99214 Obesity complicating childbirth: Secondary | ICD-10-CM | POA: Diagnosis not present

## 2017-02-11 DIAGNOSIS — D509 Iron deficiency anemia, unspecified: Secondary | ICD-10-CM | POA: Diagnosis not present

## 2017-02-11 DIAGNOSIS — K219 Gastro-esophageal reflux disease without esophagitis: Secondary | ICD-10-CM | POA: Diagnosis not present

## 2017-02-11 DIAGNOSIS — O26893 Other specified pregnancy related conditions, third trimester: Secondary | ICD-10-CM | POA: Diagnosis not present

## 2017-02-11 DIAGNOSIS — M160 Bilateral primary osteoarthritis of hip: Secondary | ICD-10-CM | POA: Diagnosis not present

## 2017-02-11 HISTORY — DX: Unspecified abnormal cytological findings in specimens from vagina: R87.629

## 2017-02-11 LAB — CBC
HEMATOCRIT: 33.7 % — AB (ref 36.0–46.0)
Hemoglobin: 11.1 g/dL — ABNORMAL LOW (ref 12.0–15.0)
MCH: 26.4 pg (ref 26.0–34.0)
MCHC: 32.9 g/dL (ref 30.0–36.0)
MCV: 80.2 fL (ref 78.0–100.0)
PLATELETS: 235 10*3/uL (ref 150–400)
RBC: 4.2 MIL/uL (ref 3.87–5.11)
RDW: 15.2 % (ref 11.5–15.5)
WBC: 6.2 10*3/uL (ref 4.0–10.5)

## 2017-02-11 LAB — TYPE AND SCREEN
ABO/RH(D): B POS
Antibody Screen: NEGATIVE

## 2017-02-11 LAB — ABO/RH: ABO/RH(D): B POS

## 2017-02-12 ENCOUNTER — Inpatient Hospital Stay (HOSPITAL_COMMUNITY)
Admission: RE | Admit: 2017-02-12 | Discharge: 2017-02-18 | DRG: 787 | Disposition: A | Discharge status: Home / Self Care | Payer: Federal, State, Local not specified - PPO | Source: Ambulatory Visit | Attending: Obstetrics and Gynecology | Admitting: Obstetrics and Gynecology

## 2017-02-12 ENCOUNTER — Encounter (HOSPITAL_COMMUNITY)
Admission: RE | Disposition: A | Discharge status: Home / Self Care | Payer: Self-pay | Source: Ambulatory Visit | Attending: Obstetrics and Gynecology

## 2017-02-12 ENCOUNTER — Inpatient Hospital Stay (HOSPITAL_COMMUNITY): Payer: Federal, State, Local not specified - PPO | Admitting: Anesthesiology

## 2017-02-12 ENCOUNTER — Encounter (HOSPITAL_COMMUNITY): Payer: Self-pay | Admitting: *Deleted

## 2017-02-12 DIAGNOSIS — A6000 Herpesviral infection of urogenital system, unspecified: Secondary | ICD-10-CM | POA: Diagnosis present

## 2017-02-12 DIAGNOSIS — D252 Subserosal leiomyoma of uterus: Secondary | ICD-10-CM | POA: Diagnosis present

## 2017-02-12 DIAGNOSIS — Z23 Encounter for immunization: Secondary | ICD-10-CM | POA: Diagnosis not present

## 2017-02-12 DIAGNOSIS — K219 Gastro-esophageal reflux disease without esophagitis: Secondary | ICD-10-CM | POA: Diagnosis present

## 2017-02-12 DIAGNOSIS — O9989 Other specified diseases and conditions complicating pregnancy, childbirth and the puerperium: Secondary | ICD-10-CM | POA: Diagnosis not present

## 2017-02-12 DIAGNOSIS — O115 Pre-existing hypertension with pre-eclampsia, complicating the puerperium: Secondary | ICD-10-CM | POA: Diagnosis not present

## 2017-02-12 DIAGNOSIS — O1002 Pre-existing essential hypertension complicating childbirth: Secondary | ICD-10-CM | POA: Diagnosis present

## 2017-02-12 DIAGNOSIS — O9832 Other infections with a predominantly sexual mode of transmission complicating childbirth: Secondary | ICD-10-CM | POA: Diagnosis present

## 2017-02-12 DIAGNOSIS — M160 Bilateral primary osteoarthritis of hip: Secondary | ICD-10-CM | POA: Diagnosis present

## 2017-02-12 DIAGNOSIS — M166 Other bilateral secondary osteoarthritis of hip: Secondary | ICD-10-CM | POA: Diagnosis not present

## 2017-02-12 DIAGNOSIS — O99214 Obesity complicating childbirth: Secondary | ICD-10-CM | POA: Diagnosis present

## 2017-02-12 DIAGNOSIS — O1003 Pre-existing essential hypertension complicating the puerperium: Secondary | ICD-10-CM | POA: Diagnosis not present

## 2017-02-12 DIAGNOSIS — O26893 Other specified pregnancy related conditions, third trimester: Secondary | ICD-10-CM | POA: Diagnosis present

## 2017-02-12 DIAGNOSIS — O9902 Anemia complicating childbirth: Secondary | ICD-10-CM | POA: Diagnosis present

## 2017-02-12 DIAGNOSIS — O134 Gestational [pregnancy-induced] hypertension without significant proteinuria, complicating childbirth: Secondary | ICD-10-CM | POA: Diagnosis not present

## 2017-02-12 DIAGNOSIS — O164 Unspecified maternal hypertension, complicating childbirth: Secondary | ICD-10-CM | POA: Diagnosis not present

## 2017-02-12 DIAGNOSIS — O1093 Unspecified pre-existing hypertension complicating the puerperium: Secondary | ICD-10-CM

## 2017-02-12 DIAGNOSIS — O3413 Maternal care for benign tumor of corpus uteri, third trimester: Secondary | ICD-10-CM | POA: Diagnosis present

## 2017-02-12 DIAGNOSIS — D509 Iron deficiency anemia, unspecified: Secondary | ICD-10-CM | POA: Diagnosis present

## 2017-02-12 DIAGNOSIS — Z3A00 Weeks of gestation of pregnancy not specified: Secondary | ICD-10-CM | POA: Diagnosis not present

## 2017-02-12 DIAGNOSIS — Z3A38 38 weeks gestation of pregnancy: Secondary | ICD-10-CM | POA: Diagnosis not present

## 2017-02-12 DIAGNOSIS — O9962 Diseases of the digestive system complicating childbirth: Secondary | ICD-10-CM | POA: Diagnosis present

## 2017-02-12 DIAGNOSIS — I10 Essential (primary) hypertension: Secondary | ICD-10-CM | POA: Diagnosis present

## 2017-02-12 LAB — RPR: RPR: NONREACTIVE

## 2017-02-12 LAB — CBC
HCT: 34.4 % — ABNORMAL LOW (ref 36.0–46.0)
Hemoglobin: 11.5 g/dL — ABNORMAL LOW (ref 12.0–15.0)
MCH: 26.6 pg (ref 26.0–34.0)
MCHC: 33.4 g/dL (ref 30.0–36.0)
MCV: 79.6 fL (ref 78.0–100.0)
PLATELETS: 249 10*3/uL (ref 150–400)
RBC: 4.32 MIL/uL (ref 3.87–5.11)
RDW: 15.2 % (ref 11.5–15.5)
WBC: 6.1 10*3/uL (ref 4.0–10.5)

## 2017-02-12 SURGERY — Surgical Case
Anesthesia: Spinal

## 2017-02-12 MED ORDER — SIMETHICONE 80 MG PO CHEW
80.0000 mg | CHEWABLE_TABLET | ORAL | Status: DC
  Administered 2017-02-12 – 2017-02-18 (×6): 80 mg via ORAL
  Filled 2017-02-12 (×6): qty 1

## 2017-02-12 MED ORDER — ONDANSETRON HCL 4 MG/2ML IJ SOLN
INTRAMUSCULAR | Status: DC | PRN
  Administered 2017-02-12: 4 mg via INTRAVENOUS

## 2017-02-12 MED ORDER — FENTANYL CITRATE (PF) 100 MCG/2ML IJ SOLN: 25.0000 ug | INTRAMUSCULAR | Status: DC | PRN

## 2017-02-12 MED ORDER — OXYCODONE-ACETAMINOPHEN 5-325 MG PO TABS: 2.0000 | ORAL_TABLET | ORAL | Status: DC | PRN

## 2017-02-12 MED ORDER — SODIUM CHLORIDE 0.9 % IR SOLN
Status: DC | PRN
  Administered 2017-02-12: 1300 mL

## 2017-02-12 MED ORDER — SIMETHICONE 80 MG PO CHEW
80.0000 mg | CHEWABLE_TABLET | Freq: Three times a day (TID) | ORAL | Status: DC
  Administered 2017-02-12 – 2017-02-18 (×17): 80 mg via ORAL
  Filled 2017-02-12 (×17): qty 1

## 2017-02-12 MED ORDER — OXYTOCIN 10 UNIT/ML IJ SOLN
INTRAVENOUS | Status: DC | PRN
  Administered 2017-02-12: 40 [IU] via INTRAVENOUS

## 2017-02-12 MED ORDER — COCONUT OIL OIL: 1.0000 "application " | TOPICAL_OIL | Status: DC | PRN

## 2017-02-12 MED ORDER — ZOLPIDEM TARTRATE 5 MG PO TABS: 5.0000 mg | ORAL_TABLET | Freq: Every evening | ORAL | Status: DC | PRN

## 2017-02-12 MED ORDER — EPHEDRINE SULFATE 50 MG/ML IJ SOLN
INTRAMUSCULAR | Status: DC | PRN
  Administered 2017-02-12 (×4): 10 mg via INTRAVENOUS

## 2017-02-12 MED ORDER — BUPIVACAINE HCL (PF) 0.25 % IJ SOLN
INTRAMUSCULAR | Status: AC
  Filled 2017-02-12: qty 30

## 2017-02-12 MED ORDER — KETOROLAC TROMETHAMINE 30 MG/ML IJ SOLN: 30.0000 mg | Freq: Four times a day (QID) | INTRAMUSCULAR | Status: AC | PRN

## 2017-02-12 MED ORDER — LABETALOL HCL 200 MG PO TABS
300.0000 mg | ORAL_TABLET | Freq: Two times a day (BID) | ORAL | Status: DC
  Administered 2017-02-12 – 2017-02-15 (×7): 300 mg via ORAL
  Filled 2017-02-12 (×7): qty 1

## 2017-02-12 MED ORDER — ONDANSETRON HCL 4 MG/2ML IJ SOLN
INTRAMUSCULAR | Status: AC
  Filled 2017-02-12: qty 2

## 2017-02-12 MED ORDER — FAMOTIDINE 20 MG PO TABS
20.0000 mg | ORAL_TABLET | Freq: Once | ORAL | Status: AC
  Administered 2017-02-12: 20 mg via ORAL
  Filled 2017-02-12: qty 1

## 2017-02-12 MED ORDER — NALOXONE HCL 0.4 MG/ML IJ SOLN: 0.4000 mg | INTRAMUSCULAR | Status: DC | PRN

## 2017-02-12 MED ORDER — SODIUM CHLORIDE 0.9% FLUSH: 3.0000 mL | INTRAVENOUS | Status: DC | PRN

## 2017-02-12 MED ORDER — DIPHENHYDRAMINE HCL 50 MG/ML IJ SOLN: 12.5000 mg | INTRAMUSCULAR | Status: DC | PRN

## 2017-02-12 MED ORDER — SENNOSIDES-DOCUSATE SODIUM 8.6-50 MG PO TABS
2.0000 | ORAL_TABLET | ORAL | Status: DC
  Administered 2017-02-12 – 2017-02-18 (×6): 2 via ORAL
  Filled 2017-02-12 (×6): qty 2

## 2017-02-12 MED ORDER — PHENYLEPHRINE 8 MG IN D5W 100 ML (0.08MG/ML) PREMIX OPTIME
INJECTION | INTRAVENOUS | Status: DC | PRN
  Administered 2017-02-12: 60 ug/min via INTRAVENOUS

## 2017-02-12 MED ORDER — FENTANYL CITRATE (PF) 100 MCG/2ML IJ SOLN
INTRAMUSCULAR | Status: AC
  Filled 2017-02-12: qty 2

## 2017-02-12 MED ORDER — FENTANYL CITRATE (PF) 100 MCG/2ML IJ SOLN
INTRAMUSCULAR | Status: DC | PRN
  Administered 2017-02-12: 10 ug via INTRATHECAL

## 2017-02-12 MED ORDER — DEXTROSE 5 % IV SOLN
3.0000 g | INTRAVENOUS | Status: AC
  Administered 2017-02-12: 3 g via INTRAVENOUS
  Filled 2017-02-12: qty 3000

## 2017-02-12 MED ORDER — NALBUPHINE HCL 10 MG/ML IJ SOLN: 5.0000 mg | Freq: Once | INTRAMUSCULAR | Status: DC | PRN

## 2017-02-12 MED ORDER — PHENYLEPHRINE 8 MG IN D5W 100 ML (0.08MG/ML) PREMIX OPTIME
INJECTION | INTRAVENOUS | Status: AC
  Filled 2017-02-12: qty 100

## 2017-02-12 MED ORDER — IBUPROFEN 600 MG PO TABS: 600.0000 mg | ORAL_TABLET | Freq: Four times a day (QID) | ORAL | Status: DC

## 2017-02-12 MED ORDER — NALBUPHINE HCL 10 MG/ML IJ SOLN: 5.0000 mg | INTRAMUSCULAR | Status: DC | PRN

## 2017-02-12 MED ORDER — LACTATED RINGERS IV SOLN
INTRAVENOUS | Status: DC
  Administered 2017-02-12: 19:00:00 via INTRAVENOUS

## 2017-02-12 MED ORDER — SOD CITRATE-CITRIC ACID 500-334 MG/5ML PO SOLN
30.0000 mL | Freq: Once | ORAL | Status: AC
  Administered 2017-02-12: 30 mL via ORAL
  Filled 2017-02-12: qty 15

## 2017-02-12 MED ORDER — DEXTROSE 5 % IV SOLN: 1.0000 ug/kg/h | INTRAVENOUS | Status: DC | PRN

## 2017-02-12 MED ORDER — BUPIVACAINE IN DEXTROSE 0.75-8.25 % IT SOLN
INTRATHECAL | Status: DC | PRN
  Administered 2017-02-12: 1.6 mL via INTRATHECAL

## 2017-02-12 MED ORDER — ACETAMINOPHEN 500 MG PO TABS
1000.0000 mg | ORAL_TABLET | Freq: Four times a day (QID) | ORAL | Status: AC
  Administered 2017-02-12 – 2017-02-13 (×4): 1000 mg via ORAL
  Filled 2017-02-12 (×4): qty 2

## 2017-02-12 MED ORDER — EPHEDRINE 5 MG/ML INJ
INTRAVENOUS | Status: AC
  Filled 2017-02-12: qty 10

## 2017-02-12 MED ORDER — DIPHENHYDRAMINE HCL 25 MG PO CAPS: 25.0000 mg | ORAL_CAPSULE | Freq: Four times a day (QID) | ORAL | Status: DC | PRN

## 2017-02-12 MED ORDER — IBUPROFEN 100 MG/5ML PO SUSP
600.0000 mg | Freq: Four times a day (QID) | ORAL | Status: DC
  Administered 2017-02-12 – 2017-02-18 (×24): 600 mg via ORAL
  Filled 2017-02-12 (×30): qty 30

## 2017-02-12 MED ORDER — MEPERIDINE HCL 25 MG/ML IJ SOLN: 6.2500 mg | INTRAMUSCULAR | Status: DC | PRN

## 2017-02-12 MED ORDER — BUPIVACAINE HCL (PF) 0.25 % IJ SOLN
INTRAMUSCULAR | Status: DC | PRN
  Administered 2017-02-12: 10 mL

## 2017-02-12 MED ORDER — LACTATED RINGERS IV SOLN
INTRAVENOUS | Status: DC
  Administered 2017-02-12 (×3): via INTRAVENOUS

## 2017-02-12 MED ORDER — PRENATAL MULTIVITAMIN CH
1.0000 | ORAL_TABLET | Freq: Every day | ORAL | Status: DC
  Administered 2017-02-16 – 2017-02-17 (×2): 1 via ORAL
  Filled 2017-02-12 (×6): qty 1

## 2017-02-12 MED ORDER — MORPHINE SULFATE (PF) 0.5 MG/ML IJ SOLN
INTRAMUSCULAR | Status: DC | PRN
  Administered 2017-02-12: .2 mg via INTRATHECAL

## 2017-02-12 MED ORDER — DEXTROSE 5 % IV SOLN
INTRAVENOUS | Status: AC
  Filled 2017-02-12: qty 3000

## 2017-02-12 MED ORDER — MORPHINE SULFATE (PF) 0.5 MG/ML IJ SOLN
INTRAMUSCULAR | Status: AC
  Filled 2017-02-12: qty 10

## 2017-02-12 MED ORDER — OXYTOCIN 40 UNITS IN LACTATED RINGERS INFUSION - SIMPLE MED: 2.5000 [IU]/h | INTRAVENOUS | Status: AC

## 2017-02-12 MED ORDER — SCOPOLAMINE 1 MG/3DAYS TD PT72: 1.0000 | MEDICATED_PATCH | Freq: Once | TRANSDERMAL | Status: DC

## 2017-02-12 MED ORDER — SIMETHICONE 80 MG PO CHEW: 80.0000 mg | CHEWABLE_TABLET | ORAL | Status: DC | PRN

## 2017-02-12 MED ORDER — BUPIVACAINE-EPINEPHRINE (PF) 0.25% -1:200000 IJ SOLN
INTRAMUSCULAR | Status: AC
  Filled 2017-02-12: qty 30

## 2017-02-12 MED ORDER — WITCH HAZEL-GLYCERIN EX PADS: 1.0000 "application " | MEDICATED_PAD | CUTANEOUS | Status: DC | PRN

## 2017-02-12 MED ORDER — DIPHENHYDRAMINE HCL 25 MG PO CAPS: 25.0000 mg | ORAL_CAPSULE | ORAL | Status: DC | PRN

## 2017-02-12 MED ORDER — OXYCODONE-ACETAMINOPHEN 5-325 MG PO TABS: 1.0000 | ORAL_TABLET | ORAL | Status: DC | PRN

## 2017-02-12 MED ORDER — LACTATED RINGERS IV SOLN
INTRAVENOUS | Status: DC | PRN
  Administered 2017-02-12: 09:00:00 via INTRAVENOUS

## 2017-02-12 MED ORDER — MENTHOL 3 MG MT LOZG: 1.0000 | LOZENGE | OROMUCOSAL | Status: DC | PRN

## 2017-02-12 MED ORDER — SCOPOLAMINE 1 MG/3DAYS TD PT72
1.0000 | MEDICATED_PATCH | Freq: Once | TRANSDERMAL | Status: DC
  Administered 2017-02-12: 1.5 mg via TRANSDERMAL
  Filled 2017-02-12: qty 1

## 2017-02-12 MED ORDER — OXYTOCIN 10 UNIT/ML IJ SOLN
INTRAMUSCULAR | Status: AC
  Filled 2017-02-12: qty 4

## 2017-02-12 MED ORDER — ONDANSETRON HCL 4 MG/2ML IJ SOLN: 4.0000 mg | Freq: Three times a day (TID) | INTRAMUSCULAR | Status: DC | PRN

## 2017-02-12 MED ORDER — PROMETHAZINE HCL 25 MG/ML IJ SOLN: 6.2500 mg | INTRAMUSCULAR | Status: DC | PRN

## 2017-02-12 MED ORDER — DIBUCAINE 1 % RE OINT: 1.0000 "application " | TOPICAL_OINTMENT | RECTAL | Status: DC | PRN

## 2017-02-12 SURGICAL SUPPLY — 49 items
BARRIER ADHS 3X4 INTERCEED (GAUZE/BANDAGES/DRESSINGS) ×3 IMPLANT
BENZOIN TINCTURE PRP APPL 2/3 (GAUZE/BANDAGES/DRESSINGS) ×3 IMPLANT
CHLORAPREP W/TINT 26ML (MISCELLANEOUS) ×3 IMPLANT
CLAMP CORD UMBIL (MISCELLANEOUS) IMPLANT
CLOSURE STERI-STRIP 1/2X4 (GAUZE/BANDAGES/DRESSINGS) ×1
CLOSURE WOUND 1/2 X4 (GAUZE/BANDAGES/DRESSINGS)
CLOTH BEACON ORANGE TIMEOUT ST (SAFETY) ×3 IMPLANT
CLSR STERI-STRIP ANTIMIC 1/2X4 (GAUZE/BANDAGES/DRESSINGS) ×2 IMPLANT
CONTAINER PREFILL 10% NBF 15ML (MISCELLANEOUS) IMPLANT
DRAPE C SECTION CLR SCREEN (DRAPES) ×3 IMPLANT
DRSG OPSITE POSTOP 4X10 (GAUZE/BANDAGES/DRESSINGS) ×3 IMPLANT
ELECT REM PT RETURN 9FT ADLT (ELECTROSURGICAL) ×3
ELECTRODE REM PT RTRN 9FT ADLT (ELECTROSURGICAL) ×1 IMPLANT
EXTRACTOR VACUUM M CUP 4 TUBE (SUCTIONS) ×2 IMPLANT
EXTRACTOR VACUUM M CUP 4' TUBE (SUCTIONS) ×1
GLOVE BIOGEL PI IND STRL 7.0 (GLOVE) ×2 IMPLANT
GLOVE BIOGEL PI INDICATOR 7.0 (GLOVE) ×4
GLOVE ECLIPSE 6.5 STRL STRAW (GLOVE) ×3 IMPLANT
GOWN STRL REUS W/TWL LRG LVL3 (GOWN DISPOSABLE) ×6 IMPLANT
HOVERMATT SINGLE USE (MISCELLANEOUS) ×3 IMPLANT
KIT ABG SYR 3ML LUER SLIP (SYRINGE) IMPLANT
NEEDLE HYPO 22GX1.5 SAFETY (NEEDLE) ×3 IMPLANT
NEEDLE HYPO 25X5/8 SAFETYGLIDE (NEEDLE) IMPLANT
NS IRRIG 1000ML POUR BTL (IV SOLUTION) ×3 IMPLANT
PACK C SECTION WH (CUSTOM PROCEDURE TRAY) ×3 IMPLANT
PAD OB MATERNITY 4.3X12.25 (PERSONAL CARE ITEMS) ×3 IMPLANT
RETRACTOR TRAXI PANNICULUS (MISCELLANEOUS) ×1 IMPLANT
RTRCTR C-SECT PINK 25CM LRG (MISCELLANEOUS) IMPLANT
SPONGE LAP 18X18 X RAY DECT (DISPOSABLE) ×9 IMPLANT
STRIP CLOSURE SKIN 1/2X4 (GAUZE/BANDAGES/DRESSINGS) IMPLANT
SUT CHROMIC GUT AB #0 18 (SUTURE) IMPLANT
SUT MNCRL 0 VIOLET CTX 36 (SUTURE) ×3 IMPLANT
SUT MON AB 2-0 SH 27 (SUTURE)
SUT MON AB 2-0 SH27 (SUTURE) IMPLANT
SUT MON AB 3-0 SH 27 (SUTURE)
SUT MON AB 3-0 SH27 (SUTURE) IMPLANT
SUT MON AB 4-0 PS1 27 (SUTURE) IMPLANT
SUT MONOCRYL 0 CTX 36 (SUTURE) ×6
SUT PLAIN 2 0 (SUTURE)
SUT PLAIN 2 0 XLH (SUTURE) IMPLANT
SUT PLAIN ABS 2-0 CT1 27XMFL (SUTURE) IMPLANT
SUT VIC AB 0 CT1 36 (SUTURE) ×6 IMPLANT
SUT VIC AB 2-0 CT1 27 (SUTURE) ×2
SUT VIC AB 2-0 CT1 TAPERPNT 27 (SUTURE) ×1 IMPLANT
SUT VIC AB 4-0 PS2 27 (SUTURE) IMPLANT
SYR CONTROL 10ML LL (SYRINGE) ×3 IMPLANT
TOWEL OR 17X24 6PK STRL BLUE (TOWEL DISPOSABLE) ×3 IMPLANT
TRAXI PANNICULUS RETRACTOR (MISCELLANEOUS) ×2
TRAY FOLEY BAG SILVER LF 14FR (SET/KITS/TRAYS/PACK) IMPLANT

## 2017-02-12 NOTE — Transfer of Care (Signed)
Immediate Anesthesia Transfer of Care Note  Patient: Kylie Robertson  Procedure(s) Performed: Primary CESAREAN SECTION (N/A )  Patient Location: PACU  Anesthesia Type:Spinal  Level of Consciousness: awake, alert  and oriented  Airway & Oxygen Therapy: Patient Spontanous Breathing  Post-op Assessment: Report given to RN and Post -op Vital signs reviewed and stable  Post vital signs: Reviewed and stable  Last Vitals:  Vitals:   02/12/17 0810  BP: (!) 157/94  Pulse: 63    Last Pain: There were no vitals filed for this visit.       Complications: No apparent anesthesia complications

## 2017-02-12 NOTE — Anesthesia Preprocedure Evaluation (Addendum)
Anesthesia Evaluation  Patient identified by MRN, date of birth, ID band Patient awake    Reviewed: Allergy & Precautions, NPO status , Patient's Chart, lab work & pertinent test results, reviewed documented beta blocker date and time   Airway Mallampati: II  TM Distance: >3 FB Neck ROM: Full    Dental  (+) Teeth Intact, Dental Advisory Given   Pulmonary neg pulmonary ROS,    Pulmonary exam normal breath sounds clear to auscultation       Cardiovascular hypertension, Pt. on home beta blockers Normal cardiovascular exam Rhythm:Regular Rate:Normal     Neuro/Psych negative neurological ROS     GI/Hepatic Neg liver ROS, GERD  Medicated,  Endo/Other  Morbid obesity  Renal/GU negative Renal ROS     Musculoskeletal  (+) Arthritis ,   Abdominal   Peds  Hematology  (+) Blood dyscrasia, anemia , Plt 235k   Anesthesia Other Findings Day of surgery medications reviewed with the patient.  Reproductive/Obstetrics (+) Pregnancy                            Anesthesia Physical Anesthesia Plan  ASA: II  Anesthesia Plan: Spinal   Post-op Pain Management:    Induction:   PONV Risk Score and Plan: 2 and Dexamethasone and Ondansetron  Airway Management Planned:   Additional Equipment:   Intra-op Plan:   Post-operative Plan:   Informed Consent: I have reviewed the patients History and Physical, chart, labs and discussed the procedure including the risks, benefits and alternatives for the proposed anesthesia with the patient or authorized representative who has indicated his/her understanding and acceptance.   Dental advisory given  Plan Discussed with: CRNA, Anesthesiologist and Surgeon  Anesthesia Plan Comments: (Discussed risks and benefits of and differences between spinal and general. Discussed risks of spinal including headache, backache, failure, bleeding, infection, and nerve damage.  Patient consents to spinal. Questions answered. Coagulation studies and platelet count acceptable.)        Anesthesia Quick Evaluation

## 2017-02-12 NOTE — Anesthesia Postprocedure Evaluation (Signed)
Anesthesia Post Note  Patient: Kylie Robertson  Procedure(s) Performed: Primary CESAREAN SECTION (N/A )     Patient location during evaluation: Mother Baby Anesthesia Type: Spinal Level of consciousness: awake and alert and oriented Pain management: satisfactory to patient Vital Signs Assessment: post-procedure vital signs reviewed and stable Respiratory status: spontaneous breathing and nonlabored ventilation Cardiovascular status: stable Postop Assessment: no headache, no backache, patient able to bend at knees, no signs of nausea or vomiting and adequate PO intake Anesthetic complications: no    Last Vitals:  Vitals:   02/12/17 1401 02/12/17 1503  BP: 134/72   Pulse: 60   Resp: 18 18  Temp: (!) 36.4 C 36.8 C  SpO2: 100% 100%    Last Pain:  Vitals:   02/12/17 1503  TempSrc: Oral  PainSc: 0-No pain   Pain Goal:                 Kylie Robertson

## 2017-02-12 NOTE — Lactation Note (Signed)
This note was copied from a baby's chart. Lactation Consultation Note  Patient Name: Kylie Robertson [id number] Date: 02/12/2017 Reason for consult: Initial assessment;Term;[facility] Breastfeeding consultation services and support information given and reviewed.  Mom took a breastfeeding class.  This  Is mom's first baby and newborn is 74 hours old.  Assisted with positioning baby in football hold.  Baby sleepy but latched and fed for 6 minutes.  Reviewed hand expression and breast massage.  Good drops of colostrum expressed.  Instructed to feed baby with cues and call for assist/concerns prn.  Maternal Data Has patient been taught Hand Expression?: Yes Does the patient have breastfeeding experience prior to this delivery?: No  Feeding Feeding Type: Breast Fed Length of feed: 6 min  LATCH Score Latch: Repeated attempts needed to sustain latch, nipple held in mouth throughout feeding, stimulation needed to elicit sucking reflex.  Audible Swallowing: A few with stimulation  Type of Nipple: Everted at rest and after stimulation  Comfort (Breast/Nipple): Soft / non-tender  Hold (Positioning): Assistance needed to correctly position infant at breast and maintain latch.  LATCH Score: 7  Interventions Interventions: Breast feeding basics reviewed;Assisted with latch;Breast compression;Skin to skin;Adjust position;Breast massage;Support pillows;Hand express;Position options  Lactation Tools Discussed/Used     Consult Status Consult Status: Follow-up Date: 02/13/17 Follow-up type: In-patient    Ave Filter 02/12/2017, 2:52 PM

## 2017-02-12 NOTE — Brief Op Note (Signed)
02/12/2017  10:01 AM  PATIENT:  Alric Seton  41 y.o. female  PRE-OPERATIVE DIAGNOSIS:  Osteoarthritis of the hip, Chronic Hypertension, term gestation  POST-OPERATIVE DIAGNOSIS:  Osteoarthritis of the hip, Chronic Hypertension, term gestation  PROCEDURE:  Primary cesarean section, kerr hysterotomy  SURGEON:  Surgeon(s) and Role:    * Servando Salina, MD - Primary  PHYSICIAN ASSISTANT:   ASSISTANTS: Artelia Laroche, CNM   ANESTHESIA:   spinal FINDINGS;  Live female hyperextended floating vtx, SS fibroids, nl tubes and ovaries, post placenta EBL:  800 mL   BLOOD ADMINISTERED:none  DRAINS: none   LOCAL MEDICATIONS USED:  MARCAINE     SPECIMEN:  Source of Specimen:  placenta  DISPOSITION OF SPECIMEN:  N/A  COUNTS:  YES  TOURNIQUET:  * No tourniquets in log *  DICTATION: .Other Dictation: Dictation Number [id number]  PLAN OF CARE: Admit to inpatient   PATIENT DISPOSITION:  PACU - hemodynamically stable.   Delay start of Pharmacological VTE agent (>24hrs) due to surgical blood loss or risk of bleeding: no

## 2017-02-12 NOTE — Addendum Note (Signed)
Addendum  created 02/12/17 1841 by Jonna Munro, CRNA   Sign clinical note

## 2017-02-12 NOTE — Anesthesia Postprocedure Evaluation (Signed)
Anesthesia Post Note  Patient: Kylie Robertson  Procedure(s) Performed: Primary CESAREAN SECTION (N/A )     Patient location during evaluation: PACU Anesthesia Type: Spinal Level of consciousness: oriented and awake and alert Pain management: pain level controlled Vital Signs Assessment: post-procedure vital signs reviewed and stable Respiratory status: spontaneous breathing, respiratory function stable and patient connected to nasal cannula oxygen Cardiovascular status: blood pressure returned to baseline and stable Postop Assessment: no headache, no backache, no apparent nausea or vomiting, patient able to bend at knees and spinal receding Anesthetic complications: no    Last Vitals:  Vitals:   02/12/17 1253 02/12/17 1401  BP: 138/76 134/72  Pulse: (!) 52 60  Resp: 18 18  Temp: 36.4 C (!) 36.4 C  SpO2: 100% 100%    Last Pain:  Vitals:   02/12/17 1401  TempSrc: Oral  PainSc:    Pain Goal:                 Catalina Gravel

## 2017-02-12 NOTE — Anesthesia Procedure Notes (Signed)
Spinal  Patient location during procedure: OR Start time: 02/12/2017 8:46 AM End time: 02/12/2017 8:48 AM Staffing Anesthesiologist: Catalina Gravel, MD Performed: anesthesiologist  Preanesthetic Checklist Completed: patient identified, surgical consent, pre-op evaluation, timeout performed, IV checked, risks and benefits discussed and monitors and equipment checked Spinal Block Patient position: sitting Prep: site prepped and draped and DuraPrep Patient monitoring: continuous pulse ox and blood pressure Approach: midline Location: L3-4 Injection technique: single-shot Needle Needle type: Pencan  Needle gauge: 24 G Assessment Sensory level: T4 Additional Notes Functioning IV was confirmed and monitors were applied. Sterile prep and drape, including hand hygiene, mask and sterile gloves were used. The patient was positioned and the spine was prepped. The skin was anesthetized with lidocaine.  Free flow of clear CSF was obtained prior to injecting local anesthetic into the CSF.  The spinal needle aspirated freely following injection.  The needle was carefully withdrawn.  The patient tolerated the procedure well. Consent was obtained prior to procedure with all questions answered and concerns addressed. Risks including but not limited to bleeding, infection, nerve damage, paralysis, failed block, inadequate analgesia, allergic reaction, high spinal, itching and headache were discussed and the patient wished to proceed.   Hoy Morn, MD

## 2017-02-13 ENCOUNTER — Other Ambulatory Visit: Payer: Self-pay

## 2017-02-13 LAB — CBC
HEMATOCRIT: 28.7 % — AB (ref 36.0–46.0)
HEMOGLOBIN: 9.4 g/dL — AB (ref 12.0–15.0)
MCH: 26.5 pg (ref 26.0–34.0)
MCHC: 32.8 g/dL (ref 30.0–36.0)
MCV: 80.8 fL (ref 78.0–100.0)
Platelets: 217 10*3/uL (ref 150–400)
RBC: 3.55 MIL/uL — ABNORMAL LOW (ref 3.87–5.11)
RDW: 15.3 % (ref 11.5–15.5)
WBC: 7.9 10*3/uL (ref 4.0–10.5)

## 2017-02-13 MED ORDER — FERROUS SULFATE 325 (65 FE) MG PO TABS
325.0000 mg | ORAL_TABLET | Freq: Every day | ORAL | Status: DC
  Administered 2017-02-13 – 2017-02-18 (×6): 325 mg via ORAL
  Filled 2017-02-13 (×6): qty 1

## 2017-02-13 MED ORDER — MAGNESIUM OXIDE 400 (241.3 MG) MG PO TABS
400.0000 mg | ORAL_TABLET | Freq: Every day | ORAL | Status: DC
  Administered 2017-02-13 – 2017-02-18 (×6): 400 mg via ORAL
  Filled 2017-02-13 (×8): qty 1

## 2017-02-13 NOTE — Progress Notes (Signed)
POD# 1  S: Pt notes pain controlled w/ po meds, minimal lochia, nl void, out of bed w/o dizziness or chest pain, tol reg po, + flatus. Pt is  breastfeeding  Vitals:   02/12/17 2248 02/13/17 0250 02/13/17 0644 02/13/17 1031  BP: 120/67 (!) 121/53 (!) 104/58 124/69  Pulse: 60 (!) 59 (!) 58 (!) 57  Resp: 20 16 18 20   Temp: 98 F (36.7 C) 97.8 F (36.6 C) 98.5 F (36.9 C) 98.4 F (36.9 C)  TempSrc: Oral Oral Oral   SpO2: 95% 98% 100%   Weight:      Height:        Gen: well appearing CV: RRR Pulm: CTAB Abd: soft, ND, approp tender, fundus below umbilicus, NT Inc: C/D/I,  LE: tr edema, NT  CBC    Component Value Date/Time   WBC 7.9 02/13/2017 0533   RBC 3.55 (L) 02/13/2017 0533   HGB 9.4 (L) 02/13/2017 0533   HCT 28.7 (L) 02/13/2017 0533   PLT 217 02/13/2017 0533   MCV 80.8 02/13/2017 0533   MCH 26.5 02/13/2017 0533   MCHC 32.8 02/13/2017 0533   RDW 15.3 02/13/2017 0533    A/P: POD#  1 s/p PCS for hip arthritis - post-op. Doing well.  - anemia, start iron, stool softerner - consented for circumcision  Ala Dach 02/13/2017 12:31 PM

## 2017-02-14 ENCOUNTER — Encounter (HOSPITAL_COMMUNITY): Payer: Self-pay | Admitting: Obstetrics and Gynecology

## 2017-02-14 DIAGNOSIS — D509 Iron deficiency anemia, unspecified: Secondary | ICD-10-CM | POA: Diagnosis present

## 2017-02-14 DIAGNOSIS — I10 Essential (primary) hypertension: Secondary | ICD-10-CM | POA: Diagnosis present

## 2017-02-14 MED ORDER — NIFEDIPINE ER OSMOTIC RELEASE 30 MG PO TB24
30.0000 mg | ORAL_TABLET | Freq: Every day | ORAL | Status: DC
  Administered 2017-02-14 – 2017-02-16 (×3): 30 mg via ORAL
  Filled 2017-02-14 (×3): qty 1

## 2017-02-14 NOTE — Progress Notes (Signed)
POD# 2  S: Pt notes pain controlled w/ po meds (NSAIDS only), minimal lochia, nl void, out of bed w/o dizziness or chest pain, tol reg po, + flatus. Pt is  breastfeeding  Vitals:   02/13/17 1031 02/13/17 1700 02/13/17 2142 02/14/17 0500  BP: 124/69 (!) 150/85 (!) 146/69 (!) 148/77  Pulse: (!) 57 72 68 62  Resp: 20 18  18   Temp: 98.4 F (36.9 C) 98.4 F (36.9 C)  98.2 F (36.8 C)  TempSrc:    Oral  SpO2:      Weight:      Height:        Gen: well appearing CV: RRR Pulm: CTAB Abd: soft, ND, approp tender, fundus below umbilicus, NT Inc: C/D/I,  LE: tr edema, NT  CBC    Component Value Date/Time   WBC 7.9 02/13/2017 0533   RBC 3.55 (L) 02/13/2017 0533   HGB 9.4 (L) 02/13/2017 0533   HCT 28.7 (L) 02/13/2017 0533   PLT 217 02/13/2017 0533   MCV 80.8 02/13/2017 0533   MCH 26.5 02/13/2017 0533   MCHC 32.8 02/13/2017 0533   RDW 15.3 02/13/2017 0533    A/P: POD#  2 s/p PCS - post-op. Doing well.  - Anemia, on iron - chronic htn, on lisinopril prior to preg, on labetalol through preg, increased dose in the last month, increase in bp PP, will start Procardia now. D/w pt to f/u with PCP by 6 wks PP - anticipate d/c tomorrow.   Ala Dach 02/14/2017 10:18 AM

## 2017-02-15 LAB — CBC
HEMATOCRIT: 29 % — AB (ref 36.0–46.0)
HEMOGLOBIN: 9.3 g/dL — AB (ref 12.0–15.0)
MCH: 26.3 pg (ref 26.0–34.0)
MCHC: 32.1 g/dL (ref 30.0–36.0)
MCV: 82.2 fL (ref 78.0–100.0)
Platelets: 261 10*3/uL (ref 150–400)
RBC: 3.53 MIL/uL — ABNORMAL LOW (ref 3.87–5.11)
RDW: 15.7 % — AB (ref 11.5–15.5)
WBC: 6.8 10*3/uL (ref 4.0–10.5)

## 2017-02-15 LAB — COMPREHENSIVE METABOLIC PANEL
ALBUMIN: 2.7 g/dL — AB (ref 3.5–5.0)
ALK PHOS: 77 U/L (ref 38–126)
ALT: 43 U/L (ref 14–54)
ANION GAP: 10 (ref 5–15)
AST: 48 U/L — ABNORMAL HIGH (ref 15–41)
BILIRUBIN TOTAL: 0.6 mg/dL (ref 0.3–1.2)
BUN: 11 mg/dL (ref 6–20)
CALCIUM: 8.9 mg/dL (ref 8.9–10.3)
CO2: 22 mmol/L (ref 22–32)
Chloride: 105 mmol/L (ref 101–111)
Creatinine, Ser: 0.79 mg/dL (ref 0.44–1.00)
Glucose, Bld: 92 mg/dL (ref 65–99)
POTASSIUM: 3.8 mmol/L (ref 3.5–5.1)
Sodium: 137 mmol/L (ref 135–145)
TOTAL PROTEIN: 6.1 g/dL — AB (ref 6.5–8.1)

## 2017-02-15 LAB — PROTEIN / CREATININE RATIO, URINE
CREATININE, URINE: 45 mg/dL
PROTEIN CREATININE RATIO: 0.18 mg/mg{creat} — AB (ref 0.00–0.15)
TOTAL PROTEIN, URINE: 8 mg/dL

## 2017-02-15 MED ORDER — HYDROCHLOROTHIAZIDE 12.5 MG PO CAPS
12.5000 mg | ORAL_CAPSULE | Freq: Every day | ORAL | Status: DC
  Administered 2017-02-15: 12.5 mg via ORAL
  Filled 2017-02-15 (×2): qty 1

## 2017-02-15 NOTE — Progress Notes (Signed)
Nurse midwife said to continue taking Q4 BP checks and measuring intake and output.

## 2017-02-15 NOTE — Op Note (Signed)
NAME:  Kylie Robertson, Kylie Robertson           ACCOUNT NO.:  MEDICAL RECORD NO.:  [id number]  LOCATION:                                 FACILITY:  PHYSICIAN:  Servando Salina, M.D.    DATE OF BIRTH:  DATE OF PROCEDURE:  02/12/2017 DATE OF DISCHARGE:                              OPERATIVE REPORT   PREOPERATIVE DIAGNOSES:  Osteoarthritis of the hip, chronic hypertension in third trimester, term gestation.  PROCEDURES:  Primary cesarean section, Kerr hysterotomy.  POSTOPERATIVE DIAGNOSIS:  Osteoarthritis of the hip, chronic hypertension in third trimester, term gestation, subserosal fibroids  ANESTHESIA:  Spinal.  SURGEON:  Servando Salina, M.D.  ASSISTANT:  Artelia Laroche, C.N.M.  DESCRIPTION OF PROCEDURE:  Under adequate spinal anesthesia, the patient was placed in the supine position with left lateral tilt.  She was sterilely prepped and draped in usual fashion.  Indwelling Foley catheter was sterilely placed.  Marcaine 0.25% was injected along the planned Pfannenstiel skin incision site.  Pfannenstiel skin incision was then made, carried down to the rectus fascia.  Rectus fascia was opened transversely.  Rectus fascia was then bluntly and sharply dissected off the rectus muscle in superior and inferior fashion.  The rectus muscle was split in the midline.  The parietal peritoneum was entered sharply and extended.  A self-retaining Alexis retractor was then placed.  A curvilinear low-transverse uterine incision was then made and extended with bandage scissors.  Artificial rupture of membranes occurred. Copious clear amniotic fluid was noted.  Initial attempt at the floating vertex was unsuccessful.  The baby's head was hyperextended.  Vacuum was applied in several locations, noted to deflects the head with subsequent delivery of a live female.  Delayed clamping of the cord was done.  Cord was clamped, cut.  The baby was transferred to the awaiting pediatricians, who assigned  Apgars 9 and 9 at one and five minutes. Placenta was posterior and manually removed.  Uterine cavity was cleaned of debris.  Uterine incision had no extension, was closed in 2 layers, the first layer with 0 Monocryl running lock stitch, second layer was imbricated using 0 Monocryl suture.  Normal tubes and ovaries were noted bilaterally.  Superficial subserosal fibroids were noted on the anterior wall, no larger than 2.5 cm.  The abdomen was irrigated and suctioned of debris.  Small bleeders were cauterized.  The Alexis retractor was removed.  Interceed was placed in inverted T fashion overlying the uterine incision.  The parietal peritoneum was closed with 2-0 Vicryl. The rectus fascia was closed with 0 Vicryl x2.  The subcutaneous area was irrigated, small bleeders cauterized.  Interrupted 2-0 plain sutures placed and the skin approximated using 4-0 Vicryl subcuticular closure. Steri-Strips and benzoin were placed.  SPECIMENS:  Placenta not sent to Pathology.  ESTIMATED BLOOD LOSS:  800 mL.  INTRAOPERATIVE FLUID:  3300 mL.  URINE OUTPUT:  100 mL, clear yellow urine.  Sponge and instrument counts x2 were correct.  COMPLICATION:  None.  The patient tolerated the procedure well, was transferred to the recovery room in stable condition.     Servando Salina, M.D.     [place]/[facility]  D:  02/12/2017  T:  02/13/2017  Job:  [id number]

## 2017-02-15 NOTE — Progress Notes (Signed)
Midwife notified of blood pressures. Stated no new orders and no need of future notification for similar blood pressures.

## 2017-02-15 NOTE — Lactation Note (Signed)
This note was copied from a baby's chart. Lactation Consultation Note  Patient Name: Kylie Robertson VELFY'B Date: 02/15/2017 Reason for consult: Follow-up assessment;Infant weight loss;Other (Comment);Primapara;1st time breastfeeding(gain over night / supplemented per mom )  Baby is 57 hours old and if for D/C  LC updated the doc flow sheets.  As LC entered the room baby latched on the right breast / football with poor support.  LC added extra pillows and the depth improved.  Increased swallows noted, and increased with breast compressions. Per mom nipples are a little tender.  LC assessed and noted clear of breakdown.  LC recommended prior to latch - breast massage, hand express, reverse pressure , latch.  Sore nipple and engorgement prevention and tx.  Mom has a hand pump and per mom has a DEBP. Per mom will be seeing Barb Carder at [facility] 02/17/17 for [facility] O/P appt.  Mother informed of post-discharge support and given phone number to the lactation department, including services for phone call assistance; out-patient appointments; and breastfeeding support group. List of other breastfeeding resources in the community given in the handout. Encouraged mother to call for problems or concerns related to breastfeeding.   Maternal Data Has patient been taught Hand Expression?: Yes  Feeding Feeding Type: (baby latched , swallows noted ) Length of feed: 14 min(swallows noted, increased w/ breast compressions )  LATCH Score Latch: (latched with depth )  Audible Swallowing: (increased swallows with breast compressions )  Type of Nipple: (nipple well rounded )  Comfort (Breast/Nipple): (breast filling )  Hold (Positioning): (baby latched, LC repositioned hips for a deeper latch )     Interventions Interventions: Breast feeding basics reviewed;Assisted with latch;Breast massage;Breast compression;Hand pump  Lactation Tools Discussed/Used Tools: Pump(at bedside  ) Breast pump type: Manual   Consult Status Consult Status: Follow-up Date: 02/17/17 Follow-up type: Out-patient(per mom with [place] )    Jerlyn Ly Lawsyn Heiler 02/15/2017, 12:26 PM

## 2017-02-15 NOTE — Progress Notes (Addendum)
Subjective: POD# 3 Information for the patient's newborn:  Kylie, Robertson [[id number]]  female    circ completed Baby name: Kylie Robertson  Reports feeling well, denies PEC s/sx Feeding: breast Patient reports tolerating PO.  Breast symptoms:+ colostrum Pain controlled withibuprofen (OTC) Denies HA/SOB/C/P/N/V/dizziness. Flatus present. She reports vaginal bleeding as normal, without clots.  She is ambulating, urinating without difficulty.     Objective:   VS:    Vitals:   02/14/17 1927 02/14/17 2213 02/14/17 2345 02/15/17 0601  BP: (!) 173/87 (!) 169/81 (!) 145/79 (!) 153/85  Pulse: (!) 59 79 69 62  Resp: 20   18  Temp: 98.6 F (37 C)   98.1 F (36.7 C)  TempSrc: Oral   Oral  SpO2: 100%     Weight:      Height:        No intake or output data in the 24 hours ending 02/15/17 0842      Recent Labs    02/13/17 0533  WBC 7.9  HGB 9.4*  HCT 28.7*  PLT 217     Blood type: --/--/B POS, B POS (12/27 1105)  Rubella: Immune (06/08 0000)     Physical Exam:  General: alert, cooperative and no distress Abdomen: soft, nontender, normal bowel sounds Incision: clean, dry and intact Uterine Fundus: firm, below umbilicus, nontender Lochia: minimal Ext: edema +1 pedal / pretibial and Homans sign is negative, no sign of DVT      Assessment/Plan: 41 y.o.   POD# 3. [id number]                  Principal Problem:   Postpartum care following cesarean delivery (12/28) Active Problems:   Cesarean delivery    IDA (iron deficiency anemia)   Chronic hypertension  - BP elevated to severe range overnight  - Procardia 30 xl added yesterday to labetalol 300 mg BID regimen  - will rpt PEC labs now and consult w/ MD when available re medication adjustment.  Routine post-op care  Juliene Pina, CNM, MSN 02/15/2017, 8:42 AM  Addendum:  LFT's doubled, plts and kidney function wnl Will start HCTZ 12.5 mg daily, monitor BP q 2 hrs this afternoon, I&O, PCR pending. May  consider DC tonight if BP remains stable and PCR WNL, f/u LFT's and BP check outpatient.   POC in consult with Dr. Sharalyn Ink, MSN, CNM 02/15/2017, 11:44 AM

## 2017-02-16 DIAGNOSIS — O115 Pre-existing hypertension with pre-eclampsia, complicating the puerperium: Secondary | ICD-10-CM | POA: Diagnosis not present

## 2017-02-16 LAB — COMPREHENSIVE METABOLIC PANEL
ALT: 73 U/L — AB (ref 14–54)
AST: 63 U/L — ABNORMAL HIGH (ref 15–41)
Albumin: 3 g/dL — ABNORMAL LOW (ref 3.5–5.0)
Alkaline Phosphatase: 79 U/L (ref 38–126)
Anion gap: 12 (ref 5–15)
BUN: 12 mg/dL (ref 6–20)
CHLORIDE: 101 mmol/L (ref 101–111)
CO2: 22 mmol/L (ref 22–32)
CREATININE: 0.9 mg/dL (ref 0.44–1.00)
Calcium: 9 mg/dL (ref 8.9–10.3)
Glucose, Bld: 97 mg/dL (ref 65–99)
Potassium: 3.8 mmol/L (ref 3.5–5.1)
SODIUM: 135 mmol/L (ref 135–145)
Total Bilirubin: 0.6 mg/dL (ref 0.3–1.2)
Total Protein: 6.9 g/dL (ref 6.5–8.1)

## 2017-02-16 LAB — CBC
HCT: 30.2 % — ABNORMAL LOW (ref 36.0–46.0)
Hemoglobin: 10 g/dL — ABNORMAL LOW (ref 12.0–15.0)
MCH: 26.6 pg (ref 26.0–34.0)
MCHC: 33.1 g/dL (ref 30.0–36.0)
MCV: 80.3 fL (ref 78.0–100.0)
PLATELETS: 317 10*3/uL (ref 150–400)
RBC: 3.76 MIL/uL — AB (ref 3.87–5.11)
RDW: 15.8 % — AB (ref 11.5–15.5)
WBC: 5.9 10*3/uL (ref 4.0–10.5)

## 2017-02-16 LAB — PROTEIN / CREATININE RATIO, URINE
CREATININE, URINE: 105 mg/dL
Protein Creatinine Ratio: 0.1 mg/mg{Cre} (ref 0.00–0.15)
TOTAL PROTEIN, URINE: 10 mg/dL

## 2017-02-16 MED ORDER — LACTATED RINGERS IV SOLN
INTRAVENOUS | Status: DC
  Administered 2017-02-16 – 2017-02-17 (×3): via INTRAVENOUS

## 2017-02-16 MED ORDER — HYDRALAZINE HCL 20 MG/ML IJ SOLN: 5.0000 mg | INTRAMUSCULAR | Status: DC | PRN

## 2017-02-16 MED ORDER — LABETALOL HCL 100 MG PO TABS
300.0000 mg | ORAL_TABLET | Freq: Three times a day (TID) | ORAL | Status: DC
  Administered 2017-02-16 – 2017-02-18 (×7): 300 mg via ORAL
  Filled 2017-02-16 (×7): qty 1

## 2017-02-16 MED ORDER — MAGNESIUM SULFATE 40 G IN LACTATED RINGERS - SIMPLE
2.0000 g/h | INTRAVENOUS | Status: AC
  Administered 2017-02-16 – 2017-02-17 (×2): 2 g/h via INTRAVENOUS
  Filled 2017-02-16 (×2): qty 40

## 2017-02-16 MED ORDER — HYDROCHLOROTHIAZIDE 25 MG PO TABS
25.0000 mg | ORAL_TABLET | Freq: Every day | ORAL | Status: DC
  Administered 2017-02-16 – 2017-02-17 (×2): 25 mg via ORAL
  Filled 2017-02-16 (×2): qty 1

## 2017-02-16 MED ORDER — MAGNESIUM SULFATE BOLUS VIA INFUSION
4.0000 g | Freq: Once | INTRAVENOUS | Status: AC
  Administered 2017-02-16: 4 g via INTRAVENOUS
  Filled 2017-02-16: qty 500

## 2017-02-16 NOTE — Progress Notes (Addendum)
Interval note:  CMP reviewed, LFT's increased from yesterday, AST/ALT 63/73 Will start Mag Sulfate therapy per protocol for preeclampsia Rpt PEC labs in AM Continue all other orders  POC discussed with patient and all questions answered Dr. Murrell Redden notified and agrees.   Juliene Pina, MSN, CNM 02/16/2017, 10:15 AM     I have reviewed patient with Derrell Lolling and have seen patient myself to review current condition and plan of care.  Questions answered Now findings c/w superimposed pre-eclampsia with severe features and begin magnesium sulfate x24 hrs.  Plan repeat labs in am.  Labetalol now tid to better manage her blood pressures. Contin hctz current dose and procardia current dose.  Will follow closely.

## 2017-02-16 NOTE — Progress Notes (Signed)
Subjective: POD# 4 Information for the patient's newborn:  Cianna, [place]par[place] [[id number]]  female    circ completed Baby name: Jerone  Resting in bed with NB at side. Breast and formula feeding.  Denies HA/NV/RUQ pain/visual changes. Voiding freely, pain well controlled w/ PO meds. + flatus.    Objective:   VS:    Vitals:   02/16/17 0200 02/16/17 0546 02/16/17 0550 02/16/17 0855  BP: (!) 148/81 (!) 147/89  140/84  Pulse: 60 61  86  Resp:  18  18  Temp:  98.9 F (37.2 C)  98.3 F (36.8 C)  TempSrc:  Oral  Oral  SpO2:      Weight:   (!) 138.8 kg (305 lb 14.4 oz)   Height:         I/O last 3 completed shifts: In: 380 [P.O.:380] Out: 1800 [Urine:1800] Total I/O In: 120 [P.O.:120] Out: 300 [Urine:300]  Net since admit + 600 cc (2 days of info missing)    Weight + 2 lbs in last 24 hrs   Hepatic Function Latest Ref Rng & Units 02/15/2017 01/26/2017  Total Protein 6.5 - 8.1 g/dL 6.1(L) 6.9  Albumin 3.5 - 5.0 g/dL 2.7(L) 2.9(L)  AST 15 - 41 U/L 48(H) 19  ALT 14 - 54 U/L 43 17  Alk Phosphatase 38 - 126 U/L 77 88  Total Bilirubin 0.3 - 1.2 mg/dL 0.6 0.4   CBC Latest Ref Rng & Units 02/15/2017 02/13/2017 02/12/2017  WBC 4.0 - 10.5 K/uL 6.8 7.9 6.1  Hemoglobin 12.0 - 15.0 g/dL 9.3(L) 9.4(L) 11.5(L)  Hematocrit 36.0 - 46.0 % 29.0(L) 28.7(L) 34.4(L)  Platelets 150 - 400 K/uL 261 217 249    Blood type: --/--/B POS, B POS (12/27 1105)  Rubella: Immune (06/08 0000)     Physical Exam:  General: alert, cooperative and no distress Abdomen: soft, nontender, normal bowel sounds Incision: clean, dry and intact Uterine Fundus: firm, below umbilicus, nontender Lochia: minimal Ext: edema +1 pretibial      Assessment/Plan: 42 y.o.   POD# 4. [id number]                  Principal Problem:   Postpartum care following cesarean delivery (12/28) Active Problems:   Cesarean delivery - stable post-op    IDA (iron deficiency anemia) - on oral fe and mag ox    Chronic  hypertension with superimposed PEC suspected  - Currently on Labetalol 300 mg BID, Procardia 30 XL daily and HCTZ 12.5 mg daily   - suboptimal diuresis with +2 lbs weight gain in last 24 hours  - BP again severe range yesterday evening (174/94 manual), mild range otherwise ( baseline 120's/80's in 1st trim)  - mildly elevated LFT's from baseline 2.5 wks ago  Will increase labetalol to 300 mg TID and HCTZ 25 mg daily, continue Procardia 30  XL daily Repeat PEC labs today for trend, consider Magnesium Sulfate therapy if LFT's remain elevated or increase Continue strict I&O and daily weights.   POC in consult with Dr. Suezanne Jacquet, CNM, MSN 02/16/2017, 9:24 AM

## 2017-02-16 NOTE — Progress Notes (Signed)
Phone report is given  to RN Eleanora Neighbor. Pt's ^ BP ^ liver enzymes, meds, Fluid retention, HTN history &  C/S recovery and infant report are reviewed.

## 2017-02-17 DIAGNOSIS — O115 Pre-existing hypertension with pre-eclampsia, complicating the puerperium: Secondary | ICD-10-CM | POA: Diagnosis not present

## 2017-02-17 DIAGNOSIS — O1093 Unspecified pre-existing hypertension complicating the puerperium: Secondary | ICD-10-CM

## 2017-02-17 LAB — CBC
HEMATOCRIT: 34.8 % — AB (ref 36.0–46.0)
Hemoglobin: 11.5 g/dL — ABNORMAL LOW (ref 12.0–15.0)
MCH: 26.9 pg (ref 26.0–34.0)
MCHC: 33 g/dL (ref 30.0–36.0)
MCV: 81.3 fL (ref 78.0–100.0)
PLATELETS: 384 10*3/uL (ref 150–400)
RBC: 4.28 MIL/uL (ref 3.87–5.11)
RDW: 16 % — ABNORMAL HIGH (ref 11.5–15.5)
WBC: 6.6 10*3/uL (ref 4.0–10.5)

## 2017-02-17 LAB — COMPREHENSIVE METABOLIC PANEL
ALBUMIN: 3.2 g/dL — AB (ref 3.5–5.0)
ALT: 72 U/L — AB (ref 14–54)
AST: 49 U/L — AB (ref 15–41)
Alkaline Phosphatase: 87 U/L (ref 38–126)
Anion gap: 12 (ref 5–15)
BILIRUBIN TOTAL: 0.3 mg/dL (ref 0.3–1.2)
BUN: 14 mg/dL (ref 6–20)
CHLORIDE: 100 mmol/L — AB (ref 101–111)
CO2: 24 mmol/L (ref 22–32)
CREATININE: 0.98 mg/dL (ref 0.44–1.00)
Calcium: 8.3 mg/dL — ABNORMAL LOW (ref 8.9–10.3)
GFR calc Af Amer: >60 mL/min (ref >60)
GLUCOSE: 109 mg/dL — AB (ref 65–99)
POTASSIUM: 4.2 mmol/L (ref 3.5–5.1)
SODIUM: 136 mmol/L (ref 135–145)
Total Protein: 7.6 g/dL (ref 6.5–8.1)

## 2017-02-17 MED ORDER — NIFEDIPINE ER OSMOTIC RELEASE 30 MG PO TB24
60.0000 mg | ORAL_TABLET | Freq: Every day | ORAL | Status: DC
  Administered 2017-02-17 – 2017-02-18 (×2): 60 mg via ORAL
  Filled 2017-02-17 (×2): qty 2

## 2017-02-17 MED ORDER — NIFEDIPINE ER OSMOTIC RELEASE 30 MG PO TB24: 30.0000 mg | ORAL_TABLET | Freq: Once | ORAL | Status: DC

## 2017-02-17 NOTE — Lactation Note (Signed)
This note was copied from a baby's chart. Lactation Consultation Note  Patient Name: Kylie Robertson Date: 02/17/2017 Reason for consult: Follow-up assessment   Baby 53 days old.  Mother is breastfeeding, pumping and formula feeding. Mother is pumping 30-45 ml and giving volume back to baby. Mother denies questions or concerns and states baby is breastfeeding well. She has 2 personal DEBP's at home. Mom encouraged to feed baby 8-12 times/24 hours and with feeding cues.  Mother is breastfeeding before offering formula and with volume she is pumping mother will need to supplement less. Reviewed engorgement care and monitoring voids/stools.    Maternal Data    Feeding Feeding Type: Bottle Fed - Breast Milk Nipple Type: Slow - flow  LATCH Score                   Interventions    Lactation Tools Discussed/Used     Consult Status Consult Status: Complete    Carlye Grippe 02/17/2017, 1:06 PM

## 2017-02-17 NOTE — Progress Notes (Addendum)
POSTOPERATIVE DAY # 5 S/P Primary LTCS for hip arthritis, baby boy "Awanda Mink"   S:         Reports feeling tired, but overall well             Tolerating po intake / no nausea / no vomiting / + flatus / + BM  Denies HA, visual scotoma, or RUQ/epigastric pain.  Reported  Slight headache yesterday, but none today.   Denies dizziness, SOB, or CP             Bleeding is light             Pain controlled with Motrin             Up ad lib / ambulatory/ voiding QS  Newborn breast feeding/pumping with some formula supplementation  / Circumcision - completed   O:  VS: BP (!) 159/89 (BP Location: Left Arm)   Pulse 66   Temp 98.1 F (36.7 C) (Oral)   Resp 20   Ht 5\' 7"  (1.702 m)   Wt 115.9 kg (255 lb 7 oz)   SpO2 98%   Breastfeeding? Unknown   BMI 40.01 kg/m   Vitals:   02/16/17 2200 02/17/17 0001 02/17/17 0545 02/17/17 0800  BP: (!) 153/86 (!) 150/86 (!) 144/92 (!) 159/89  Pulse: 77 72 68 66  Resp: 18 20 20 20   Temp: 98.2 F (36.8 C) 98.7 F (37.1 C) 98.6 F (37 C) 98.1 F (36.7 C)  TempSrc: Oral Oral Oral Oral  SpO2: 97% 100% 100% 98%  Weight:   115.9 kg (255 lb 7 oz)   Height:        LABS:               Results for orders placed or performed during the hospital encounter of 02/12/17 (from the past 24 hour(s))  Protein / creatinine ratio, urine     Status: None   Collection Time: 02/16/17 10:15 AM  Result Value Ref Range   Creatinine, Urine 105.00 mg/dL   Total Protein, Urine 10 mg/dL   Protein Creatinine Ratio 0.10 0.00 - 0.15 mg/mg[Cre]  Comprehensive metabolic panel     Status: Abnormal   Collection Time: 02/17/17  5:35 AM  Result Value Ref Range   Sodium 136 135 - 145 mmol/L   Potassium 4.2 3.5 - 5.1 mmol/L   Chloride 100 (L) 101 - 111 mmol/L   CO2 24 22 - 32 mmol/L   Glucose, Bld 109 (H) 65 - 99 mg/dL   BUN 14 6 - 20 mg/dL   Creatinine, Ser 0.98 0.44 - 1.00 mg/dL   Calcium 8.3 (L) 8.9 - 10.3 mg/dL   Total Protein 7.6 6.5 - 8.1 g/dL   Albumin 3.2 (L) 3.5 - 5.0  g/dL   AST 49 (H) 15 - 41 U/L   ALT 72 (H) 14 - 54 U/L   Alkaline Phosphatase 87 38 - 126 U/L   Total Bilirubin 0.3 0.3 - 1.2 mg/dL   GFR calc non Af Amer >60 >60 mL/min   GFR calc Af Amer >60 >60 mL/min   Anion gap 12 5 - 15  CBC     Status: Abnormal   Collection Time: 02/17/17  5:35 AM  Result Value Ref Range   WBC 6.6 4.0 - 10.5 K/uL   RBC 4.28 3.87 - 5.11 MIL/uL   Hemoglobin 11.5 (L) 12.0 - 15.0 g/dL   HCT 34.8 (L) 36.0 - 46.0 %   MCV 81.3 78.0 -  100.0 fL   MCH 26.9 26.0 - 34.0 pg   MCHC 33.0 30.0 - 36.0 g/dL   RDW 16.0 (H) 11.5 - 15.5 %   Platelets 384 150 - 400 K/uL               Bloodtype: --/--/B POS, B POS (12/27 1105)  Rubella: Immune (06/08 0000)                                             I&O: Intake/Output      01/01 0701 - 01/02 0700 01/02 0701 - 01/03 0700   P.O. 2520 400   I.V. (mL/kg) 808.3 (7) 125 (1.1)   Total Intake(mL/kg) 3328.3 (28.7) 525 (4.5)   Urine (mL/kg/hr) 5010 (1.8) 400 (1.4)   Total Output 5010 400   Net -1681.7 +125          Weight: 255.7 pounds; yesterday's weight was 305 by the bed scale - I do not think that was accurate              Physical Exam:             Alert and Oriented X3  Lungs: Clear and unlabored  Heart: regular rate and rhythm / no murmurs  Abdomen: soft, non-tender, non-distended, active bowel sounds             Fundus: firm, non-tender, U-3             Dressing: honeycomb dressing with steri-strips; honeycomb removed by me; otherwise c/d/i              Incision:  approximated with sutures / no erythema / no ecchymosis / no drainage  Perineum: intact  Lochia: appropriate, no clots  Extremities: trace pedal and tibial edema, no calf pain or tenderness, DTRs +1 on right leg, DTRs +2 on left leg, no clonus bilaterally   A/P:        POD # 5 S/P Primary LTCS                Chronic Hypertension with superimposed pre-eclampsia    - On Magnesium Sulfate since 12:38pm yesterday, continue for 24 hours   - BPs labile on  Current medications: HCTZ 25mg  daily, Labetalol 300mg  TID, Procardia 30mg  XL daily   - LFTs trending down   - Serum creatinine increased to 0.98    - Suboptimal diuresis                 -  Increase Procardia to 60mg  XL daily    - Stop HCTZ today    - Continue strict I&Os   - Continue daily weights    - Repeat CBC, CMP today      Iron deficiency anemia    - Stable of oral FE with magnesium oxide                Routine postop care    Keep incision c/d/i    Possible discharge home tomorrow   Consult for plan of care: Dr. Janae Sauce, MSN, CNM [facility]   Patient seen. Reports mild headache yesterday but no current headache. No blurry vision but patient reports overall fatigue. Patient notes no right upper quadrant pain minimal vaginal bleeding and good pain control with oral meds.  Vitals:   02/17/17 0800 02/17/17 1015 02/17/17 1017 02/17/17  1200  BP: (!) 159/89   140/75  Pulse: 66   63  Resp: 20   20  Temp: 98.1 F (36.7 C)   98.1 F (36.7 C)  TempSrc: Oral   Oral  SpO2: 98%   99%  Weight:  132.1 kg (291 lb 4.8 oz) 132.9 kg (293 lb)   Height:       General: Well-appearing, no distress Cardiovascular: Regular rate and rhythm Pulmonary: Clear to auscultation bilaterally, no wheezes Abdomen: Obese, properly tender, no right upper quadrant pain, fundus nonpalpable Incision: Dressing off, skin closed lower extremity: Trace edema, 1+ DTR, no clonus  Assessment and plan: Postpartum preeclampsia, now day 5 with magnesium sulfate started on day 4 for elevated LFTs, rising creatinine and higher blood pressures. Labetalol increased yesterday to 300 3 times a day. Patient still with elevated blood pressures and will increase her Procardia to 60 Meggs XL. Continue close blood pressure monitoring. Overall patient appears well and will be able to come off her magnesium with a 24-hour mark. Given her elevating creatinine will stop the hydrochlorothiazide.  Will repeat labs tomorrow. Labs have stopped worsening and are stable today compared to yesterday. Patient understands she'll need to stay in the hospital until labs trend down her blood pressures under better control.  Ala Dach 02/17/2017 2:51 PM

## 2017-02-18 LAB — CBC
HEMATOCRIT: 31.3 % — AB (ref 36.0–46.0)
HEMOGLOBIN: 10.3 g/dL — AB (ref 12.0–15.0)
MCH: 26.8 pg (ref 26.0–34.0)
MCHC: 32.9 g/dL (ref 30.0–36.0)
MCV: 81.5 fL (ref 78.0–100.0)
Platelets: 346 10*3/uL (ref 150–400)
RBC: 3.84 MIL/uL — ABNORMAL LOW (ref 3.87–5.11)
RDW: 16 % — ABNORMAL HIGH (ref 11.5–15.5)
WBC: 7.5 10*3/uL (ref 4.0–10.5)

## 2017-02-18 LAB — COMPREHENSIVE METABOLIC PANEL
ALK PHOS: 80 U/L (ref 38–126)
ALT: 55 U/L — ABNORMAL HIGH (ref 14–54)
AST: 29 U/L (ref 15–41)
Albumin: 3 g/dL — ABNORMAL LOW (ref 3.5–5.0)
Anion gap: 11 (ref 5–15)
BILIRUBIN TOTAL: 0.7 mg/dL (ref 0.3–1.2)
BUN: 18 mg/dL (ref 6–20)
CALCIUM: 8 mg/dL — AB (ref 8.9–10.3)
CO2: 22 mmol/L (ref 22–32)
Chloride: 102 mmol/L (ref 101–111)
Creatinine, Ser: 1.03 mg/dL — ABNORMAL HIGH (ref 0.44–1.00)
GFR calc Af Amer: >60 mL/min (ref >60)
GLUCOSE: 96 mg/dL (ref 65–99)
Potassium: 4 mmol/L (ref 3.5–5.1)
Sodium: 135 mmol/L (ref 135–145)
TOTAL PROTEIN: 7 g/dL (ref 6.5–8.1)

## 2017-02-18 MED ORDER — FERROUS SULFATE 325 (65 FE) MG PO TABS: 325.0000 mg | ORAL_TABLET | Freq: Every day | ORAL | 3 refills | Status: DC

## 2017-02-18 MED ORDER — NIFEDIPINE ER 60 MG PO TB24: 60.0000 mg | ORAL_TABLET | Freq: Every day | ORAL | 3 refills | Status: DC

## 2017-02-18 MED ORDER — IBUPROFEN 100 MG/5ML PO SUSP: 600.0000 mg | Freq: Four times a day (QID) | ORAL | 0 refills | Status: DC

## 2017-02-18 MED ORDER — LABETALOL HCL 300 MG PO TABS: 300.0000 mg | ORAL_TABLET | Freq: Three times a day (TID) | ORAL | 3 refills | Status: DC

## 2017-02-18 NOTE — Progress Notes (Signed)
Discharge instructions given, questions answered, pt states understanding. Signed and given copy.

## 2017-02-18 NOTE — Discharge Summary (Signed)
Obstetric Discharge Summary   Patient Name: Kylie Robertson DOB: 10-Oct-1975 MRN: [id number]  Date of Admission: 02/12/2017 Date of Discharge: 02/18/2017 Date of Delivery: 02/12/17 Gestational Age at Delivery: [redacted]w[redacted]d  Primary OB: [facility] - Dr. Garwin Brothers  Antepartum complications:  - Chronic HTN - AMA - Obesity - HSV-2 - Osteoarthritis of the hip Prenatal Labs:  ABO/RH: B Pos Antibody: Negative Rubella: Immune Hep B: Negative HIV: NR RPR: NR  Admitting Diagnosis: Primary c/s at term for osteoarthritis of the hip; Chronic Hypertension  Secondary Diagnoses: Patient Active Problem List   Diagnosis Date Noted  . Pre-eclampsia superimposed on chronic hypertension, delivered, with postpartum complication 31/54/0086  . Cesarean delivery  02/14/2017  . IDA (iron deficiency anemia) 02/14/2017  . Chronic hypertension 02/14/2017  . Postpartum care following cesarean delivery (12/28) 02/12/2017  . HSV-2 (herpes simplex virus 2) infection     Date of Delivery: 02/12/17 Delivered By: Dr. Garwin Brothers T. Mel Almond, CNM assist  Newborn Data: Live born female  Birth Weight: 7 lb 12.2 oz (3520 g) APGAR: 67, 80  Newborn Delivery   Birth date/time:  02/12/2017 09:21:00 Delivery type:  C-Section, Low Transverse C-section categorization:  Primary      Hospital/Postpartum Course  (Cesarean Section):  Pt. Presented for scheduled cesarean section for osteoarthritis of hip and chronic hypertension.  On POD #3, she developed superimposed pre-eclampsia with severe range BPs and elevated liver function tests.  She required 24 hours of Magnesium Sulfate, and we increased her medications to Procardia 60mg  XL and Labetalol 300mg  TID. By time of discharge on POD#6, blood pressures were improving, labs are stable, and her pain was controlled on oral pain medications; she had appropriate lochia and was ambulating, voiding without difficulty, tolerating regular diet and passing flatus.   She was  deemed stable for discharge to home.     Labs: CBC Latest Ref Rng & Units 02/18/2017 02/17/2017 02/16/2017  WBC 4.0 - 10.5 K/uL 7.5 6.6 5.9  Hemoglobin 12.0 - 15.0 g/dL 10.3(L) 11.5(L) 10.0(L)  Hematocrit 36.0 - 46.0 % 31.3(L) 34.8(L) 30.2(L)  Platelets 150 - 400 K/uL 346 384 317   B POS  Physical exam:  BP (!) 143/86 (BP Location: Left Arm)   Pulse 62   Temp 98.4 F (36.9 C) (Oral)   Resp 18   Ht 5\' 7"  (1.702 m)   Wt 132.1 kg (291 lb 3 oz) Comment: bed scale   SpO2 100%   Breastfeeding? Unknown   BMI 45.61 kg/m  General: alert and no distress Pulm: normal respiratory effort Lochia: appropriate Abdomen: soft, NT Uterine Fundus: firm, below umbilicus Perineum: healing well, no significant erythema, no significant edema ncision: c/d/i, healing well, no significant drainage, no dehiscence, no significant erythema Extremities: No evidence of DVT seen on physical exam. No lower extremity edema.   Disposition: stable, discharge to home Baby Feeding: breast milk and formula Baby Disposition: home with mom  Contraception: unsure  Rh Immune globulin given: N/A Rubella vaccine given: N/A Tdap vaccine given in AP or PP setting: UTD Flu vaccine given in AP or PP setting: UTD   Plan:  Kylie Robertson was discharged to home in good condition. Follow-up appointment at [facility] in 1 week for repeat BP check and PIH labs.   Discharge Instructions: Per After Visit Summary. Activity: Advance as tolerated. Pelvic rest for 6 weeks.  Refer to After Visit Summary Diet: Regular, Heart Healthy Discharge Medications: Allergies as of 02/18/2017   No Known Allergies     Medication List  TAKE these medications   acyclovir 200 MG capsule Commonly known as:  ZOVIRAX Take by mouth every 4 (four) hours while awake.   ferrous sulfate 325 (65 FE) MG tablet Take 1 tablet (325 mg total) by mouth daily with breakfast. Start taking on:  02/19/2017   ibuprofen 100 MG/5ML  suspension Commonly known as:  ADVIL,MOTRIN Take 30 mLs (600 mg total) by mouth every 6 (six) hours.   labetalol 300 MG tablet Commonly known as:  NORMODYNE Take 1 tablet (300 mg total) by mouth 3 (three) times daily. What changed:    medication strength  when to take this   NIFEdipine 60 MG 24 hr tablet Commonly known as:  PROCARDIA-XL/ADALAT CC Take 1 tablet (60 mg total) by mouth daily. Start taking on:  02/19/2017   Prenatal Adult Gummy/DHA/FA 0.4-25 MG Chew Chew 2 tablets by mouth daily.   ranitidine 150 MG tablet Commonly known as:  ZANTAC Take 150 mg by mouth 2 (two) times daily.            Discharge Care Instructions  (From admission, onward)        Start     Ordered   02/18/17 0000  Discharge wound care:    Comments:  Take steri-strips off after shower.  Keep incision clean and dry   02/18/17 1227     Outpatient follow up:  Follow-up Information    Servando Salina, MD. Schedule an appointment as soon as possible for a visit in 1 week(s).   Specialty:  Obstetrics and Gynecology Why:  Blood pressure check and repeat [facility] labs Contact information: [address] [place] [address] [phone]           Signed:  Lars Pinks, MSN, CNM [facility]

## 2017-02-18 NOTE — Progress Notes (Signed)
POSTOPERATIVE DAY # 6, Chronic HTN with superimposed pre-eclampsia S/P Primary LTCS for hip arthritis, baby boy "Awanda Mink"  S:         Reports feeling good, ready to go home today              Tolerating po intake / no nausea / no vomiting / + flatus / no BM ` Denies HA, visual changes, or RUQ/epigastric   Denies dizziness, SOB, or CP             Bleeding is light             Pain controlled with Motrin             Up ad lib / ambulatory/ voiding QS  Newborn breast and formula feeding  / Circumcision -completed   O:  VS: BP (!) 143/86 (BP Location: Left Arm)   Pulse 62   Temp 98.4 F (36.9 C) (Oral)   Resp 18   Ht 5\' 7"  (1.702 m)   Wt 132.1 kg (291 lb 3 oz) Comment: bed scale   SpO2 100%   Breastfeeding? Unknown   BMI 45.61 kg/m    Vitals:   02/17/17 2014 02/17/17 2346 02/18/17 0439 02/18/17 0758  BP: 129/83 133/82 (!) 147/86 (!) 143/86  Pulse: 77 70 68 62  Resp: 18 18 18 18   Temp: 98.3 F (36.8 C) 98.2 F (36.8 C) 98 F (36.7 C) 98.4 F (36.9 C)  TempSrc: Oral Oral  Oral  SpO2: 100% 100% 98% 100%  Weight:   132.1 kg (291 lb 3 oz)   Height:         LABS:               Results for orders placed or performed during the hospital encounter of 02/12/17 (from the past 24 hour(s))  CBC     Status: Abnormal   Collection Time: 02/18/17  5:59 AM  Result Value Ref Range   WBC 7.5 4.0 - 10.5 K/uL   RBC 3.84 (L) 3.87 - 5.11 MIL/uL   Hemoglobin 10.3 (L) 12.0 - 15.0 g/dL   HCT 31.3 (L) 36.0 - 46.0 %   MCV 81.5 78.0 - 100.0 fL   MCH 26.8 26.0 - 34.0 pg   MCHC 32.9 30.0 - 36.0 g/dL   RDW 16.0 (H) 11.5 - 15.5 %   Platelets 346 150 - 400 K/uL  Comprehensive metabolic panel     Status: Abnormal   Collection Time: 02/18/17  5:59 AM  Result Value Ref Range   Sodium 135 135 - 145 mmol/L   Potassium 4.0 3.5 - 5.1 mmol/L   Chloride 102 101 - 111 mmol/L   CO2 22 22 - 32 mmol/L   Glucose, Bld 96 65 - 99 mg/dL   BUN 18 6 - 20 mg/dL   Creatinine, Ser 1.03 (H) 0.44 - 1.00 mg/dL   Calcium 8.0 (L) 8.9 - 10.3 mg/dL   Total Protein 7.0 6.5 - 8.1 g/dL   Albumin 3.0 (L) 3.5 - 5.0 g/dL   AST 29 15 - 41 U/L   ALT 55 (H) 14 - 54 U/L   Alkaline Phosphatase 80 38 - 126 U/L   Total Bilirubin 0.7 0.3 - 1.2 mg/dL   GFR calc non Af Amer >60 >60 mL/min   GFR calc Af Amer >60 >60 mL/min   Anion gap 11 5 - 15               Bloodtype: --/--/B POS,  B POS (12/27 1105)  Rubella: Immune (06/08 0000)                                             I&O: Intake/Output      01/02 0701 - 01/03 0700 01/03 0701 - 01/04 0700   P.O. 520    I.V. (mL/kg) 500 (3.8)    Total Intake(mL/kg) 1020 (7.7)    Urine (mL/kg/hr) 2300 (0.7)    Total Output 2300    Net -1280                      Physical Exam:             Alert and Oriented X3  Lungs: Clear and unlabored  Heart: regular rate and rhythm / no murmurs  Abdomen: soft, non-tender, non-distended, active bowel sounds in all quadrants              Fundus: firm, non-tender, U-4             Dressing: steri-strips starting to peel off               Incision:  approximated with sutures / no erythema / no ecchymosis / no drainage.  Large panus - discussed keeping area dry   Perineum: intact  Lochia: appropriate  Extremities: trace tibial edema, no calf pain or tenderness,  A/P:        POD # 5 S/P Primary LTCS for breech                Chronic Hypertension with superimposed pre-eclampsia                        - S/p Magnesium Sulfate x 24 hours                       - BPs labile on Current medications: Labetalol 300mg  TID, Procardia 60mg  XL daily                         - LFTs trending down                         - Serum creatinine increased to 1.03                         - Diuresis improving                         - Continue strict I&Os                         - Continue daily weights                 Iron deficiency anemia                          - Stable of oral FE with magnesium oxide                Routine postop care                Discharge home today  [facility] discharge book and instructions reviewed                  Continue Labetalol 300mg  TID and Procardia 60mg  XL                F/u in 1 week for repeat labs and BP check   Consult for plan of care: Dr. Stevan Born, MSN, CNM [facility]

## 2017-02-22 DIAGNOSIS — O1093 Unspecified pre-existing hypertension complicating the puerperium: Secondary | ICD-10-CM | POA: Diagnosis not present

## 2017-02-25 ENCOUNTER — Inpatient Hospital Stay (HOSPITAL_COMMUNITY)
Admission: AD | Admit: 2017-02-25 | Payer: Federal, State, Local not specified - PPO | Source: Ambulatory Visit | Admitting: Obstetrics and Gynecology

## 2017-02-25 DIAGNOSIS — O115 Pre-existing hypertension with pre-eclampsia, complicating the puerperium: Secondary | ICD-10-CM | POA: Diagnosis not present

## 2017-03-01 DIAGNOSIS — L0231 Cutaneous abscess of buttock: Secondary | ICD-10-CM | POA: Diagnosis not present

## 2017-03-01 LAB — OB RESULTS CONSOLE GBS
GBS: NEGATIVE
GBS: NEGATIVE

## 2017-03-30 DIAGNOSIS — Z130 Encounter for screening for diseases of the blood and blood-forming organs and certain disorders involving the immune mechanism: Secondary | ICD-10-CM | POA: Diagnosis not present

## 2017-04-02 DIAGNOSIS — L0231 Cutaneous abscess of buttock: Secondary | ICD-10-CM | POA: Diagnosis not present

## 2017-06-24 DIAGNOSIS — I10 Essential (primary) hypertension: Secondary | ICD-10-CM | POA: Diagnosis not present

## 2017-06-24 DIAGNOSIS — D509 Iron deficiency anemia, unspecified: Secondary | ICD-10-CM | POA: Diagnosis not present

## 2017-06-24 DIAGNOSIS — Z6841 Body Mass Index (BMI) 40.0 and over, adult: Secondary | ICD-10-CM | POA: Diagnosis not present

## 2017-07-02 DIAGNOSIS — K080 Exfoliation of teeth due to systemic causes: Secondary | ICD-10-CM | POA: Diagnosis not present

## 2017-07-08 DIAGNOSIS — Z6841 Body Mass Index (BMI) 40.0 and over, adult: Secondary | ICD-10-CM | POA: Diagnosis not present

## 2017-07-09 DIAGNOSIS — Z6841 Body Mass Index (BMI) 40.0 and over, adult: Secondary | ICD-10-CM | POA: Diagnosis not present

## 2017-07-09 DIAGNOSIS — Z0000 Encounter for general adult medical examination without abnormal findings: Secondary | ICD-10-CM | POA: Diagnosis not present

## 2017-07-09 DIAGNOSIS — I10 Essential (primary) hypertension: Secondary | ICD-10-CM | POA: Diagnosis not present

## 2017-07-09 DIAGNOSIS — Z1331 Encounter for screening for depression: Secondary | ICD-10-CM | POA: Diagnosis not present

## 2017-08-18 DIAGNOSIS — Z3201 Encounter for pregnancy test, result positive: Secondary | ICD-10-CM | POA: Diagnosis not present

## 2017-09-01 DIAGNOSIS — Z3689 Encounter for other specified antenatal screening: Secondary | ICD-10-CM | POA: Diagnosis not present

## 2017-09-01 DIAGNOSIS — I10 Essential (primary) hypertension: Secondary | ICD-10-CM | POA: Diagnosis not present

## 2017-09-01 DIAGNOSIS — O09521 Supervision of elderly multigravida, first trimester: Secondary | ICD-10-CM | POA: Diagnosis not present

## 2017-09-01 DIAGNOSIS — Z3A09 9 weeks gestation of pregnancy: Secondary | ICD-10-CM | POA: Diagnosis not present

## 2017-09-01 LAB — OB RESULTS CONSOLE GC/CHLAMYDIA
Chlamydia: NEGATIVE
GC PROBE AMP, GENITAL: NEGATIVE

## 2017-09-01 LAB — OB RESULTS CONSOLE RPR: RPR: NONREACTIVE

## 2017-09-01 LAB — OB RESULTS CONSOLE ANTIBODY SCREEN: Antibody Screen: NEGATIVE

## 2017-09-01 LAB — OB RESULTS CONSOLE HEPATITIS B SURFACE ANTIGEN: Hepatitis B Surface Ag: NEGATIVE

## 2017-09-01 LAB — OB RESULTS CONSOLE ABO/RH: RH Type: POSITIVE

## 2017-09-01 LAB — OB RESULTS CONSOLE RUBELLA ANTIBODY, IGM: RUBELLA: IMMUNE

## 2017-09-01 LAB — OB RESULTS CONSOLE HIV ANTIBODY (ROUTINE TESTING): HIV: NONREACTIVE

## 2017-09-08 DIAGNOSIS — Z3A10 10 weeks gestation of pregnancy: Secondary | ICD-10-CM | POA: Diagnosis not present

## 2017-09-08 DIAGNOSIS — Z3689 Encounter for other specified antenatal screening: Secondary | ICD-10-CM | POA: Diagnosis not present

## 2017-09-08 DIAGNOSIS — O09521 Supervision of elderly multigravida, first trimester: Secondary | ICD-10-CM | POA: Diagnosis not present

## 2017-09-08 DIAGNOSIS — Z01419 Encounter for gynecological examination (general) (routine) without abnormal findings: Secondary | ICD-10-CM | POA: Diagnosis not present

## 2017-09-08 DIAGNOSIS — Z118 Encounter for screening for other infectious and parasitic diseases: Secondary | ICD-10-CM | POA: Diagnosis not present

## 2017-09-10 DIAGNOSIS — I10 Essential (primary) hypertension: Secondary | ICD-10-CM | POA: Diagnosis not present

## 2017-10-01 DIAGNOSIS — Z3A13 13 weeks gestation of pregnancy: Secondary | ICD-10-CM | POA: Diagnosis not present

## 2017-10-01 DIAGNOSIS — O09521 Supervision of elderly multigravida, first trimester: Secondary | ICD-10-CM | POA: Diagnosis not present

## 2017-10-01 DIAGNOSIS — Z3682 Encounter for antenatal screening for nuchal translucency: Secondary | ICD-10-CM | POA: Diagnosis not present

## 2017-10-22 DIAGNOSIS — Z361 Encounter for antenatal screening for raised alphafetoprotein level: Secondary | ICD-10-CM | POA: Diagnosis not present

## 2017-10-22 DIAGNOSIS — O10012 Pre-existing essential hypertension complicating pregnancy, second trimester: Secondary | ICD-10-CM | POA: Diagnosis not present

## 2017-10-22 DIAGNOSIS — Z3A16 16 weeks gestation of pregnancy: Secondary | ICD-10-CM | POA: Diagnosis not present

## 2017-11-05 DIAGNOSIS — O10012 Pre-existing essential hypertension complicating pregnancy, second trimester: Secondary | ICD-10-CM | POA: Diagnosis not present

## 2017-11-05 DIAGNOSIS — Z3A18 18 weeks gestation of pregnancy: Secondary | ICD-10-CM | POA: Diagnosis not present

## 2017-11-29 DIAGNOSIS — Z3A21 21 weeks gestation of pregnancy: Secondary | ICD-10-CM | POA: Diagnosis not present

## 2017-11-29 DIAGNOSIS — O10012 Pre-existing essential hypertension complicating pregnancy, second trimester: Secondary | ICD-10-CM | POA: Diagnosis not present

## 2017-12-22 DIAGNOSIS — O10012 Pre-existing essential hypertension complicating pregnancy, second trimester: Secondary | ICD-10-CM | POA: Diagnosis not present

## 2017-12-22 DIAGNOSIS — Z3A25 25 weeks gestation of pregnancy: Secondary | ICD-10-CM | POA: Diagnosis not present

## 2018-01-17 DIAGNOSIS — Z3A28 28 weeks gestation of pregnancy: Secondary | ICD-10-CM | POA: Diagnosis not present

## 2018-01-17 DIAGNOSIS — O10012 Pre-existing essential hypertension complicating pregnancy, second trimester: Secondary | ICD-10-CM | POA: Diagnosis not present

## 2018-01-17 DIAGNOSIS — Z3689 Encounter for other specified antenatal screening: Secondary | ICD-10-CM | POA: Diagnosis not present

## 2018-01-19 DIAGNOSIS — Z3A29 29 weeks gestation of pregnancy: Secondary | ICD-10-CM | POA: Diagnosis not present

## 2018-01-19 DIAGNOSIS — O10012 Pre-existing essential hypertension complicating pregnancy, second trimester: Secondary | ICD-10-CM | POA: Diagnosis not present

## 2018-01-24 DIAGNOSIS — O99810 Abnormal glucose complicating pregnancy: Secondary | ICD-10-CM | POA: Diagnosis not present

## 2018-01-24 DIAGNOSIS — Z3A29 29 weeks gestation of pregnancy: Secondary | ICD-10-CM | POA: Diagnosis not present

## 2018-01-28 DIAGNOSIS — K080 Exfoliation of teeth due to systemic causes: Secondary | ICD-10-CM | POA: Diagnosis not present

## 2018-02-02 DIAGNOSIS — Z3A31 31 weeks gestation of pregnancy: Secondary | ICD-10-CM | POA: Diagnosis not present

## 2018-02-02 DIAGNOSIS — Z23 Encounter for immunization: Secondary | ICD-10-CM | POA: Diagnosis not present

## 2018-02-02 DIAGNOSIS — O10013 Pre-existing essential hypertension complicating pregnancy, third trimester: Secondary | ICD-10-CM | POA: Diagnosis not present

## 2018-02-11 DIAGNOSIS — O10013 Pre-existing essential hypertension complicating pregnancy, third trimester: Secondary | ICD-10-CM | POA: Diagnosis not present

## 2018-02-11 DIAGNOSIS — O99810 Abnormal glucose complicating pregnancy: Secondary | ICD-10-CM | POA: Diagnosis not present

## 2018-02-11 DIAGNOSIS — Z3A32 32 weeks gestation of pregnancy: Secondary | ICD-10-CM | POA: Diagnosis not present

## 2018-02-14 DIAGNOSIS — O10013 Pre-existing essential hypertension complicating pregnancy, third trimester: Secondary | ICD-10-CM | POA: Diagnosis not present

## 2018-02-14 DIAGNOSIS — O99810 Abnormal glucose complicating pregnancy: Secondary | ICD-10-CM | POA: Diagnosis not present

## 2018-02-14 DIAGNOSIS — Z3A32 32 weeks gestation of pregnancy: Secondary | ICD-10-CM | POA: Diagnosis not present

## 2018-02-18 ENCOUNTER — Inpatient Hospital Stay (HOSPITAL_COMMUNITY)
Admission: AD | Admit: 2018-02-18 | Discharge: 2018-02-18 | Disposition: A | Discharge status: Home / Self Care | Payer: Federal, State, Local not specified - PPO | Attending: Obstetrics & Gynecology | Admitting: Obstetrics & Gynecology

## 2018-02-18 ENCOUNTER — Encounter (HOSPITAL_COMMUNITY): Payer: Self-pay | Admitting: Emergency Medicine

## 2018-02-18 DIAGNOSIS — O10913 Unspecified pre-existing hypertension complicating pregnancy, third trimester: Secondary | ICD-10-CM | POA: Diagnosis not present

## 2018-02-18 DIAGNOSIS — O10919 Unspecified pre-existing hypertension complicating pregnancy, unspecified trimester: Secondary | ICD-10-CM

## 2018-02-18 DIAGNOSIS — R030 Elevated blood-pressure reading, without diagnosis of hypertension: Secondary | ICD-10-CM | POA: Diagnosis not present

## 2018-02-18 DIAGNOSIS — O10013 Pre-existing essential hypertension complicating pregnancy, third trimester: Secondary | ICD-10-CM | POA: Diagnosis not present

## 2018-02-18 DIAGNOSIS — Z3A33 33 weeks gestation of pregnancy: Secondary | ICD-10-CM

## 2018-02-18 LAB — COMPREHENSIVE METABOLIC PANEL
ALK PHOS: 77 U/L (ref 38–126)
ALT: 20 U/L (ref 0–44)
ANION GAP: 7 (ref 5–15)
AST: 17 U/L (ref 15–41)
Albumin: 3.1 g/dL — ABNORMAL LOW (ref 3.5–5.0)
BILIRUBIN TOTAL: 0.5 mg/dL (ref 0.3–1.2)
BUN: 10 mg/dL (ref 6–20)
CALCIUM: 8.9 mg/dL (ref 8.9–10.3)
CO2: 22 mmol/L (ref 22–32)
CREATININE: 0.74 mg/dL (ref 0.44–1.00)
Chloride: 105 mmol/L (ref 98–111)
GFR calc non Af Amer: >60 mL/min (ref >60)
GLUCOSE: 80 mg/dL (ref 70–99)
Potassium: 4.1 mmol/L (ref 3.5–5.1)
SODIUM: 134 mmol/L — AB (ref 135–145)
TOTAL PROTEIN: 7 g/dL (ref 6.5–8.1)

## 2018-02-18 LAB — URINALYSIS, ROUTINE W REFLEX MICROSCOPIC
Bacteria, UA: NONE SEEN
Bilirubin Urine: NEGATIVE
GLUCOSE, UA: NEGATIVE mg/dL
Ketones, ur: NEGATIVE mg/dL
Leukocytes, UA: NEGATIVE
Nitrite: NEGATIVE
PH: 6 (ref 5.0–8.0)
PROTEIN: NEGATIVE mg/dL
SPECIFIC GRAVITY, URINE: 1.008 (ref 1.005–1.030)

## 2018-02-18 LAB — CBC
HEMATOCRIT: 34.4 % — AB (ref 36.0–46.0)
HEMOGLOBIN: 11.1 g/dL — AB (ref 12.0–15.0)
MCH: 27.1 pg (ref 26.0–34.0)
MCHC: 32.3 g/dL (ref 30.0–36.0)
MCV: 84.1 fL (ref 80.0–100.0)
Platelets: 304 10*3/uL (ref 150–400)
RBC: 4.09 MIL/uL (ref 3.87–5.11)
RDW: 14.1 % (ref 11.5–15.5)
WBC: 7.2 10*3/uL (ref 4.0–10.5)
nRBC: 0 % (ref 0.0–0.2)

## 2018-02-18 LAB — PROTEIN / CREATININE RATIO, URINE: Creatinine, Urine: 60 mg/dL

## 2018-02-18 MED ORDER — LABETALOL HCL 200 MG PO TABS: 400.0000 mg | ORAL_TABLET | Freq: Two times a day (BID) | ORAL | 0 refills | Status: DC

## 2018-02-18 NOTE — MAU Note (Signed)
Pt was sent by [facility] for increased BP. +FM , Denies ctx, LOF, VB.

## 2018-02-18 NOTE — Discharge Instructions (Signed)
.  Preeclampsia and Eclampsia    Preeclampsia is a serious condition that may develop during pregnancy. It is also called toxemia of pregnancy. This condition causes high blood pressure along with other symptoms, such as swelling and headaches. These symptoms may develop as the condition gets worse. Preeclampsia may occur at 20 weeks of pregnancy or later.  Diagnosing and treating preeclampsia early is very important. If not treated early, it can cause serious problems for you and your baby. One problem it can lead to is eclampsia. Eclampsia is a condition that causes muscle jerking or shaking (convulsions or seizures) and other serious problems for the mother. During pregnancy, delivering your baby may be the best treatment for preeclampsia or eclampsia. For most women, preeclampsia and eclampsia symptoms go away after giving birth.  In rare cases, a woman may develop preeclampsia after giving birth (postpartum preeclampsia). This usually occurs within 48 hours after childbirth but may occur up to 6 weeks after giving birth.  What are the causes?  The cause of preeclampsia is not known.  What increases the risk?  The following risk factors make you more likely to develop preeclampsia:   Being pregnant for the first time.   Having had preeclampsia during a past pregnancy.   Having a family history of preeclampsia.   Having high blood pressure.   Being pregnant with more than one baby.   Being 35 or older.   Being African-American.   Having kidney disease or diabetes.   Having medical conditions such as lupus or blood diseases.   Being very overweight (obese).  What are the signs or symptoms?  The earliest signs of preeclampsia are:   High blood pressure.   Increased protein in your urine. Your health care provider will check for this at every visit before you give birth (prenatal visit).  Other symptoms that may develop as the condition gets worse include:   Severe headaches.   Sudden weight  gain.   Swelling of the hands, face, legs, and feet.   Nausea and vomiting.   Vision problems, such as blurred or double vision.   Numbness in the face, arms, legs, and feet.   Urinating less than usual.   Dizziness.   Slurred speech.   Abdominal pain, especially upper abdominal pain.   Convulsions or seizures.  How is this diagnosed?  There are no screening tests for preeclampsia. Your health care provider will ask you about symptoms and check for signs of preeclampsia during your prenatal visits. You may also have tests that include:   Urine tests.   Blood tests.   Checking your blood pressure.   Monitoring your baby's heart rate.   Ultrasound.  How is this treated?  You and your health care provider will determine the treatment approach that is best for you. Treatment may include:   Having more frequent prenatal exams to check for signs of preeclampsia, if you have an increased risk for preeclampsia.   Medicine to lower your blood pressure.   Staying in the hospital, if your condition is severe. There, treatment will focus on controlling your blood pressure and the amount of fluids in your body (fluid retention).   Taking medicine (magnesium sulfate) to prevent seizures. This may be given as an injection or through an IV.   Taking a low-dose aspirin during your pregnancy.   Delivering your baby early, if your condition gets worse. You may have your labor started with medicine (induced), or you may have a cesarean   delivery.  Follow these instructions at home:  Eating and drinking     Drink enough fluid to keep your urine pale yellow.   Avoid caffeine.  Lifestyle   Do not use any products that contain nicotine or tobacco, such as cigarettes and e-cigarettes. If you need help quitting, ask your health care provider.   Do not use alcohol or drugs.   Avoid stress as much as possible. Rest and get plenty of sleep.  General instructions   Take over-the-counter and prescription medicines only as  told by your health care provider.   When lying down, lie on your left side. This keeps pressure off your major blood vessels.   When sitting or lying down, raise (elevate) your feet. Try putting some pillows underneath your lower legs.   Exercise regularly. Ask your health care provider what kinds of exercise are best for you.   Keep all follow-up and prenatal visits as told by your health care provider. This is important.  How is this prevented?  There is no known way of preventing preeclampsia or eclampsia from developing. However, to lower your risk of complications and detect problems early:   Get regular prenatal care. Your health care provider may be able to diagnose and treat the condition early.   Maintain a healthy weight. Ask your health care provider for help managing weight gain during pregnancy.   Work with your health care provider to manage any long-term (chronic) health conditions you have, such as diabetes or kidney problems.   You may have tests of your blood pressure and kidney function after giving birth.   Your health care provider may have you take low-dose aspirin during your next pregnancy.  Contact a health care provider if:   You have symptoms that your health care provider told you may require more treatment or monitoring, such as:  ? Headaches.  ? Nausea or vomiting.  ? Abdominal pain.  ? Dizziness.  ? Light-headedness.  Get help right away if:   You have severe:  ? Abdominal pain.  ? Headaches that do not get better.  ? Dizziness.  ? Vision problems.  ? Confusion.  ? Nausea or vomiting.   You have any of the following:  ? A seizure.  ? Sudden, rapid weight gain.  ? Sudden swelling in your hands, ankles, or face.  ? Trouble moving any part of your body.  ? Numbness in any part of your body.  ? Trouble speaking.  ? Abnormal bleeding.   You faint.  Summary   Preeclampsia is a serious condition that may develop during pregnancy. It is also called toxemia of pregnancy.   This  condition causes high blood pressure along with other symptoms, such as swelling and headaches.   Diagnosing and treating preeclampsia early is very important. If not treated early, it can cause serious problems for you and your baby.   Get help right away if you have symptoms that your health care provider told you to watch for.  This information is not intended to replace advice given to you by your health care provider. Make sure you discuss any questions you have with your health care provider.  Document Released: 01/31/2000 Document Revised: 01/19/2017 Document Reviewed: 09/09/2015  Elsevier Interactive Patient Education  2019 [employer]

## 2018-02-18 NOTE — MAU Provider Note (Signed)
History     CSN: [id number]  Arrival date and time: 02/18/18 1618   First Provider Initiated Contact with Patient 02/18/18 1709      Chief Complaint  Patient presents with  . Hypertension   HPI Kylie Robertson is a 43 y.o. G3P1011 at [redacted]w[redacted]d who presents from the office for evaluation of elevated blood pressures. She has CHTN and takes labetalol 300mg  BID. She denies any headache, visual changes or epigastric pain. Denies any pain, vaginal bleeding or discharge. Reports normal fetal movement.   OB History    Gravida  3   Para  1   Term  1   Preterm      AB  1   Living  1     SAB      TAB  1   Ectopic      Multiple  0   Live Births  1           Past Medical History:  Diagnosis Date  . ASCUS (atypical squamous cells of undetermined significance) on Pap smear   . Endocervical polyp   . HSV-2 (herpes simplex virus 2) infection   . Hypertension   . Osteoarthritis   . Vaginal Pap smear, abnormal     Past Surgical History:  Procedure Laterality Date  . CESAREAN SECTION N/A 02/12/2017   Procedure: Primary CESAREAN SECTION;  Surgeon: Servando Salina, MD;  Location: [facility];  Service: Obstetrics;  Laterality: N/A;  EDD: 02/19/17  . HIP SURGERY    . WISDOM TOOTH EXTRACTION      Family History  Problem Relation Age of Onset  . Hypertension Paternal Grandfather   . Diabetes Paternal Grandfather   . Hypertension Paternal Grandmother   . Diabetes Paternal Grandmother   . Hypertension Maternal Grandmother   . Diabetes Maternal Grandmother   . Kidney disease Maternal Grandmother   . Hypertension Maternal Grandfather   . Diabetes Maternal Grandfather   . Heart attack Father   . Drug abuse Father     Social History   Tobacco Use  . Smoking status: Never Smoker  . Smokeless tobacco: Never Used  Substance Use Topics  . Alcohol use: Yes  . Drug use: No    Allergies: No Known Allergies  Medications Prior to Admission  Medication  Sig Dispense Refill Last Dose  . acyclovir (ZOVIRAX) 200 MG capsule Take by mouth every 4 (four) hours while awake.   Past Month at Unknown time  . ferrous sulfate 325 (65 FE) MG tablet Take 1 tablet (325 mg total) by mouth daily with breakfast. 30 tablet 3   . ibuprofen (ADVIL,MOTRIN) 100 MG/5ML suspension Take 30 mLs (600 mg total) by mouth every 6 (six) hours. 237 mL 0   . labetalol (NORMODYNE) 300 MG tablet Take 1 tablet (300 mg total) by mouth 3 (three) times daily. 90 tablet 3   . NIFEdipine (PROCARDIA-XL/ADALAT CC) 60 MG 24 hr tablet Take 1 tablet (60 mg total) by mouth daily. 30 tablet 3   . Prenatal MV & Min w/FA-DHA (PRENATAL ADULT GUMMY/DHA/FA) 0.4-25 MG CHEW Chew 2 tablets by mouth daily.   01/25/2017 at Unknown time  . ranitidine (ZANTAC) 150 MG tablet Take 150 mg by mouth 2 (two) times daily.   01/25/2017 at Unknown time    Review of Systems  Constitutional: Negative.  Negative for fatigue and fever.  HENT: Negative.   Respiratory: Negative.  Negative for shortness of breath.   Cardiovascular: Negative.  Negative for chest pain.  Gastrointestinal: Negative.  Negative for abdominal pain, constipation, diarrhea, nausea and vomiting.  Genitourinary: Negative.  Negative for dysuria.  Neurological: Negative.  Negative for dizziness and headaches.   Physical Exam   Blood pressure (!) 155/80, pulse 61, last menstrual period 06/29/2017, unknown if currently breastfeeding.  Patient Vitals for the past 24 hrs:  BP Temp Temp src Pulse Resp  02/18/18 1746 (!) 144/68 - - 73 -  02/18/18 1731 (!) 145/72 - - 68 -  02/18/18 1716 (!) 151/72 - - 68 -  02/18/18 1701 (!) 155/80 97.9 F (36.6 C) Oral 61 17  02/18/18 1655 (!) 152/83 - - 62 17   Physical Exam  Nursing note and vitals reviewed. Constitutional: She is oriented to person, place, and time. She appears well-developed and well-nourished. No distress.  HENT:  Head: Normocephalic.  Eyes: Pupils are equal, round, and reactive to  light.  Cardiovascular: Normal rate, regular rhythm and normal heart sounds.  Respiratory: Effort normal and breath sounds normal. No respiratory distress.  GI: Soft. Bowel sounds are normal. She exhibits no distension. There is no abdominal tenderness.  Neurological: She is alert and oriented to person, place, and time. She has normal reflexes. She displays normal reflexes. She exhibits normal muscle tone. Coordination normal.  Skin: Skin is warm and dry.  Psychiatric: She has a normal mood and affect. Her behavior is normal. Judgment and thought content normal.    MAU Course  Procedures Results for orders placed or performed during the hospital encounter of 02/18/18 (from the past 24 hour(s))  Urinalysis, Routine w reflex microscopic     Status: Abnormal   Collection Time: 02/18/18  5:05 PM  Result Value Ref Range   Color, Urine STRAW (A) YELLOW   APPearance CLEAR CLEAR   Specific Gravity, Urine 1.008 1.005 - 1.030   pH 6.0 5.0 - 8.0   Glucose, UA NEGATIVE NEGATIVE mg/dL   Hgb urine dipstick SMALL (A) NEGATIVE   Bilirubin Urine NEGATIVE NEGATIVE   Ketones, ur NEGATIVE NEGATIVE mg/dL   Protein, ur NEGATIVE NEGATIVE mg/dL   Nitrite NEGATIVE NEGATIVE   Leukocytes, UA NEGATIVE NEGATIVE   RBC / HPF 0-5 0 - 5 RBC/hpf   WBC, UA 0-5 0 - 5 WBC/hpf   Bacteria, UA NONE SEEN NONE SEEN   Squamous Epithelial / LPF 0-5 0 - 5  Protein / creatinine ratio, urine     Status: None   Collection Time: 02/18/18  5:05 PM  Result Value Ref Range   Creatinine, Urine 60.00 mg/dL   Total Protein, Urine <6 mg/dL   Protein Creatinine Ratio        0.00 - 0.15 mg/mg[Cre]  CBC     Status: Abnormal   Collection Time: 02/18/18  5:07 PM  Result Value Ref Range   WBC 7.2 4.0 - 10.5 K/uL   RBC 4.09 3.87 - 5.11 MIL/uL   Hemoglobin 11.1 (L) 12.0 - 15.0 g/dL   HCT 34.4 (L) 36.0 - 46.0 %   MCV 84.1 80.0 - 100.0 fL   MCH 27.1 26.0 - 34.0 pg   MCHC 32.3 30.0 - 36.0 g/dL   RDW 14.1 11.5 - 15.5 %   Platelets 304  150 - 400 K/uL   nRBC 0.0 0.0 - 0.2 %  Comprehensive metabolic panel     Status: Abnormal   Collection Time: 02/18/18  5:07 PM  Result Value Ref Range   Sodium 134 (L) 135 - 145 mmol/L   Potassium 4.1 3.5 - 5.1 mmol/L  Chloride 105 98 - 111 mmol/L   CO2 22 22 - 32 mmol/L   Glucose, Bld 80 70 - 99 mg/dL   BUN 10 6 - 20 mg/dL   Creatinine, Ser 0.74 0.44 - 1.00 mg/dL   Calcium 8.9 8.9 - 10.3 mg/dL   Total Protein 7.0 6.5 - 8.1 g/dL   Albumin 3.1 (L) 3.5 - 5.0 g/dL   AST 17 15 - 41 U/L   ALT 20 0 - 44 U/L   Alkaline Phosphatase 77 38 - 126 U/L   Total Bilirubin 0.5 0.3 - 1.2 mg/dL   GFR calc non Af Amer >60 >60 mL/min   GFR calc Af Amer >60 >60 mL/min   Anion gap 7 5 - 15   MDM UA CBC, CMP, Protein/creat ratio  Consulted with Dr. Glo Herring- will increase labetalol to 400mg  BID  Assessment and Plan   1. Chronic hypertension affecting pregnancy   2. [redacted] weeks gestation of pregnancy    -Discharge home in stable condition -Labetalol increased to 400mg  BID -Preeclampsia precautions discussed -Patient advised to follow-up with Dr. Garwin Brothers on Monday as scheduled -Patient may return to MAU as needed or if her condition were to change or worsen   [place] 02/18/2018, 5:09 PM

## 2018-02-21 ENCOUNTER — Other Ambulatory Visit: Payer: Self-pay | Admitting: Obstetrics and Gynecology

## 2018-02-21 DIAGNOSIS — Z3A33 33 weeks gestation of pregnancy: Secondary | ICD-10-CM | POA: Diagnosis not present

## 2018-02-21 DIAGNOSIS — O10013 Pre-existing essential hypertension complicating pregnancy, third trimester: Secondary | ICD-10-CM | POA: Diagnosis not present

## 2018-02-22 DIAGNOSIS — K080 Exfoliation of teeth due to systemic causes: Secondary | ICD-10-CM | POA: Diagnosis not present

## 2018-02-24 DIAGNOSIS — Z3A34 34 weeks gestation of pregnancy: Secondary | ICD-10-CM | POA: Diagnosis not present

## 2018-02-24 DIAGNOSIS — O10013 Pre-existing essential hypertension complicating pregnancy, third trimester: Secondary | ICD-10-CM | POA: Diagnosis not present

## 2018-03-01 ENCOUNTER — Inpatient Hospital Stay (HOSPITAL_COMMUNITY)
Admission: AD | Admit: 2018-03-01 | Discharge: 2018-03-01 | Disposition: A | Discharge status: Home / Self Care | Payer: Federal, State, Local not specified - PPO | Source: Ambulatory Visit | Attending: Obstetrics and Gynecology | Admitting: Obstetrics and Gynecology

## 2018-03-01 ENCOUNTER — Encounter (HOSPITAL_COMMUNITY): Payer: Self-pay | Admitting: *Deleted

## 2018-03-01 ENCOUNTER — Other Ambulatory Visit: Payer: Self-pay

## 2018-03-01 DIAGNOSIS — R030 Elevated blood-pressure reading, without diagnosis of hypertension: Secondary | ICD-10-CM | POA: Diagnosis not present

## 2018-03-01 DIAGNOSIS — O10919 Unspecified pre-existing hypertension complicating pregnancy, unspecified trimester: Secondary | ICD-10-CM

## 2018-03-01 DIAGNOSIS — Z3A35 35 weeks gestation of pregnancy: Secondary | ICD-10-CM | POA: Diagnosis not present

## 2018-03-01 DIAGNOSIS — O10013 Pre-existing essential hypertension complicating pregnancy, third trimester: Secondary | ICD-10-CM | POA: Diagnosis not present

## 2018-03-01 DIAGNOSIS — Z3685 Encounter for antenatal screening for Streptococcus B: Secondary | ICD-10-CM | POA: Diagnosis not present

## 2018-03-01 LAB — COMPREHENSIVE METABOLIC PANEL
ALT: 18 U/L (ref 0–44)
ANION GAP: 8 (ref 5–15)
AST: 17 U/L (ref 15–41)
Albumin: 3.2 g/dL — ABNORMAL LOW (ref 3.5–5.0)
Alkaline Phosphatase: 92 U/L (ref 38–126)
BUN: 11 mg/dL (ref 6–20)
CHLORIDE: 106 mmol/L (ref 98–111)
CO2: 20 mmol/L — AB (ref 22–32)
Calcium: 9.2 mg/dL (ref 8.9–10.3)
Creatinine, Ser: 0.76 mg/dL (ref 0.44–1.00)
Glucose, Bld: 84 mg/dL (ref 70–99)
POTASSIUM: 4.2 mmol/L (ref 3.5–5.1)
SODIUM: 134 mmol/L — AB (ref 135–145)
Total Bilirubin: 0.7 mg/dL (ref 0.3–1.2)
Total Protein: 6.7 g/dL (ref 6.5–8.1)

## 2018-03-01 LAB — PROTEIN / CREATININE RATIO, URINE
CREATININE, URINE: 127 mg/dL
Protein Creatinine Ratio: 0.07 mg/mg{Cre} (ref 0.00–0.15)
TOTAL PROTEIN, URINE: 9 mg/dL

## 2018-03-01 LAB — CBC
HCT: 35.3 % — ABNORMAL LOW (ref 36.0–46.0)
Hemoglobin: 11.6 g/dL — ABNORMAL LOW (ref 12.0–15.0)
MCH: 27.4 pg (ref 26.0–34.0)
MCHC: 32.9 g/dL (ref 30.0–36.0)
MCV: 83.3 fL (ref 80.0–100.0)
PLATELETS: 299 10*3/uL (ref 150–400)
RBC: 4.24 MIL/uL (ref 3.87–5.11)
RDW: 14 % (ref 11.5–15.5)
WBC: 7.6 10*3/uL (ref 4.0–10.5)
nRBC: 0 % (ref 0.0–0.2)

## 2018-03-01 LAB — URIC ACID: URIC ACID, SERUM: 7 mg/dL (ref 2.5–7.1)

## 2018-03-01 LAB — OB RESULTS CONSOLE GBS: STREP GROUP B AG: NEGATIVE

## 2018-03-01 MED ORDER — BETAMETHASONE SOD PHOS & ACET 6 (3-3) MG/ML IJ SUSP
12.0000 mg | Freq: Once | INTRAMUSCULAR | Status: AC
  Administered 2018-03-01: 12 mg via INTRAMUSCULAR
  Filled 2018-03-01: qty 2

## 2018-03-01 NOTE — MAU Note (Signed)
Sent from MD office for BP evaluation.  Denies H/A, visual disturbances, or epigastric pain.  +FM.  Denies LOF or VB.

## 2018-03-01 NOTE — MAU Provider Note (Signed)
History     Chief Complaint  Patient presents with  . BP Evaluation   43 yo G2P1 BF with known chronic HTN @ [redacted]  weeks gestation  sent from office for [facility] evaluation due to elevated BP on office visit. Pt had BPP/AFI office 8/8, nl fluid. Denies h/a, visual changes or epigastric pain. Pt did not take her BP med last night    OB History    Gravida  3   Para  1   Term  1   Preterm      AB  1   Living  1     SAB      TAB  1   Ectopic      Multiple  0   Live Births  1           Past Medical History:  Diagnosis Date  . ASCUS (atypical squamous cells of undetermined significance) on Pap smear   . Endocervical polyp   . HSV-2 (herpes simplex virus 2) infection   . Hypertension   . Osteoarthritis   . Vaginal Pap smear, abnormal     Past Surgical History:  Procedure Laterality Date  . CESAREAN SECTION N/A 02/12/2017   Procedure: Primary CESAREAN SECTION;  Surgeon: Servando Salina, MD;  Location: [place];  Service: Obstetrics;  Laterality: N/A;  EDD: 02/19/17  . HIP SURGERY    . WISDOM TOOTH EXTRACTION      Family History  Problem Relation Age of Onset  . Hypertension Paternal Grandfather   . Diabetes Paternal Grandfather   . Hypertension Paternal Grandmother   . Diabetes Paternal Grandmother   . Hypertension Maternal Grandmother   . Diabetes Maternal Grandmother   . Kidney disease Maternal Grandmother   . Hypertension Maternal Grandfather   . Diabetes Maternal Grandfather   . Diabetes Mother   . Heart attack Father   . Drug abuse Father     Social History   Tobacco Use  . Smoking status: Never Smoker  . Smokeless tobacco: Never Used  Substance Use Topics  . Alcohol use: Yes  . Drug use: No    Allergies: No Known Allergies  Medications Prior to Admission  Medication Sig Dispense Refill Last Dose  . acyclovir (ZOVIRAX) 200 MG capsule Take by mouth every 4 (four) hours while awake.   Past Month at Unknown time  . ferrous sulfate  325 (65 FE) MG tablet Take 1 tablet (325 mg total) by mouth daily with breakfast. 30 tablet 3   . ibuprofen (ADVIL,MOTRIN) 100 MG/5ML suspension Take 30 mLs (600 mg total) by mouth every 6 (six) hours. 237 mL 0   . labetalol (NORMODYNE) 200 MG tablet Take 2 tablets (400 mg total) by mouth 2 (two) times daily. 120 tablet 0   . Prenatal MV & Min w/FA-DHA (PRENATAL ADULT GUMMY/DHA/FA) 0.4-25 MG CHEW Chew 2 tablets by mouth daily.   01/25/2017 at Unknown time  . ranitidine (ZANTAC) 150 MG tablet Take 150 mg by mouth 2 (two) times daily.   01/25/2017 at Unknown time     Physical Exam   Blood pressure (!) 166/103, pulse 69, temperature 98 F (36.7 C), resp. rate 18, height 5\' 7"  (1.702 m), weight (!) 142 kg, last menstrual period 06/29/2017, SpO2 99 %, unknown if currently breastfeeding.    No exam performed today, exam  done at office.  Tracing:baseline 125 (+) accels  No ctx ED Course  IMP. Chronic HTn 3rd trimester r/o superimposed preeclampsia vs exacerbation of  underlying HTN IUP @ 35  weeks Previous C/S AMA Osteoarthritis P) serial BP. Ignacio labs MDM CBC    Component Value Date/Time   WBC 7.6 03/01/2018 1258   RBC 4.24 03/01/2018 1258   HGB 11.6 (L) 03/01/2018 1258   HCT 35.3 (L) 03/01/2018 1258   PLT 299 03/01/2018 1258   MCV 83.3 03/01/2018 1258   MCH 27.4 03/01/2018 1258   MCHC 32.9 03/01/2018 1258   RDW 14.0 03/01/2018 1258   CMP Latest Ref Rng & Units 03/01/2018 02/18/2018 02/18/2017  Glucose 70 - 99 mg/dL 84 80 96  BUN 6 - 20 mg/dL 11 10 18   Creatinine 0.44 - 1.00 mg/dL 0.76 0.74 1.03(H)  Sodium 135 - 145 mmol/L 134(L) 134(L) 135  Potassium 3.5 - 5.1 mmol/L 4.2 4.1 4.0  Chloride 98 - 111 mmol/L 106 105 102  CO2 22 - 32 mmol/L 20(L) 22 22  Calcium 8.9 - 10.3 mg/dL 9.2 8.9 8.0(L)  Total Protein 6.5 - 8.1 g/dL 6.7 7.0 7.0  Total Bilirubin 0.3 - 1.2 mg/dL 0.7 0.5 0.7  Alkaline Phos 38 - 126 U/L 92 77 80  AST 15 - 41 U/L 17 17 29   ALT 0 - 44 U/L 18 20 55(H)   protein/creatinine ratio 0.07 Impression ; exacerbation of chronic HTN . Increase BP med.( labetalol dose to 300mg  po bid P) d/c home. Preeclampsia warning signs. Keep scheduled OB appt this week Marvene Staff, MD 1:09 PM 03/01/2018

## 2018-03-01 NOTE — Discharge Instructions (Signed)
Labor precautions  Preeclampsia and Eclampsia  Preeclampsia is a serious condition that may develop during pregnancy. It is also called toxemia of pregnancy. This condition causes high blood pressure along with other symptoms, such as swelling and headaches. These symptoms may develop as the condition gets worse. Preeclampsia may occur at 20 weeks of pregnancy or later. Diagnosing and treating preeclampsia early is very important. If not treated early, it can cause serious problems for you and your baby. One problem it can lead to is eclampsia. Eclampsia is a condition that causes muscle jerking or shaking (convulsions or seizures) and other serious problems for the mother. During pregnancy, delivering your baby may be the best treatment for preeclampsia or eclampsia. For most women, preeclampsia and eclampsia symptoms go away after giving birth. In rare cases, a woman may develop preeclampsia after giving birth (postpartum preeclampsia). This usually occurs within 48 hours after childbirth but may occur up to 6 weeks after giving birth. What are the causes? The cause of preeclampsia is not known. What increases the risk? The following risk factors make you more likely to develop preeclampsia:  Being pregnant for the first time.  Having had preeclampsia during a past pregnancy.  Having a family history of preeclampsia.  Having high blood pressure.  Being pregnant with more than one baby.  Being 13 or older.  Being African-American.  Having kidney disease or diabetes.  Having medical conditions such as lupus or blood diseases.  Being very overweight (obese). What are the signs or symptoms? The earliest signs of preeclampsia are:  High blood pressure.  Increased protein in your urine. Your health care provider will check for this at every visit before you give birth (prenatal visit). Other symptoms that may develop as the condition gets worse include:  Severe headaches.  Sudden  weight gain.  Swelling of the hands, face, legs, and feet.  Nausea and vomiting.  Vision problems, such as blurred or double vision.  Numbness in the face, arms, legs, and feet.  Urinating less than usual.  Dizziness.  Slurred speech.  Abdominal pain, especially upper abdominal pain.  Convulsions or seizures. How is this diagnosed? There are no screening tests for preeclampsia. Your health care provider will ask you about symptoms and check for signs of preeclampsia during your prenatal visits. You may also have tests that include:  Urine tests.  Blood tests.  Checking your blood pressure.  Monitoring your babys heart rate.  Ultrasound. How is this treated? You and your health care provider will determine the treatment approach that is best for you. Treatment may include:  Having more frequent prenatal exams to check for signs of preeclampsia, if you have an increased risk for preeclampsia.  Medicine to lower your blood pressure.  Staying in the hospital, if your condition is severe. There, treatment will focus on controlling your blood pressure and the amount of fluids in your body (fluid retention).  Taking medicine (magnesium sulfate) to prevent seizures. This may be given as an injection or through an IV.  Taking a low-dose aspirin during your pregnancy.  Delivering your baby early, if your condition gets worse. You may have your labor started with medicine (induced), or you may have a cesarean delivery. Follow these instructions at home: Eating and drinking   Drink enough fluid to keep your urine pale yellow.  Avoid caffeine. Lifestyle  Do not use any products that contain nicotine or tobacco, such as cigarettes and e-cigarettes. If you need help quitting, ask your health  care provider.  Do not use alcohol or drugs.  Avoid stress as much as possible. Rest and get plenty of sleep. General instructions  Take over-the-counter and prescription medicines  only as told by your health care provider.  When lying down, lie on your left side. This keeps pressure off your major blood vessels.  When sitting or lying down, raise (elevate) your feet. Try putting some pillows underneath your lower legs.  Exercise regularly. Ask your health care provider what kinds of exercise are best for you.  Keep all follow-up and prenatal visits as told by your health care provider. This is important. How is this prevented? There is no known way of preventing preeclampsia or eclampsia from developing. However, to lower your risk of complications and detect problems early:  Get regular prenatal care. Your health care provider may be able to diagnose and treat the condition early.  Maintain a healthy weight. Ask your health care provider for help managing weight gain during pregnancy.  Work with your health care provider to manage any long-term (chronic) health conditions you have, such as diabetes or kidney problems.  You may have tests of your blood pressure and kidney function after giving birth.  Your health care provider may have you take low-dose aspirin during your next pregnancy. Contact a health care provider if:  You have symptoms that your health care provider told you may require more treatment or monitoring, such as: ? Headaches. ? Nausea or vomiting. ? Abdominal pain. ? Dizziness. ? Light-headedness. Get help right away if:  You have severe: ? Abdominal pain. ? Headaches that do not get better. ? Dizziness. ? Vision problems. ? Confusion. ? Nausea or vomiting.  You have any of the following: ? A seizure. ? Sudden, rapid weight gain. ? Sudden swelling in your hands, ankles, or face. ? Trouble moving any part of your body. ? Numbness in any part of your body. ? Trouble speaking. ? Abnormal bleeding.  You faint. Summary  Preeclampsia is a serious condition that may develop during pregnancy. It is also called toxemia of  pregnancy.  This condition causes high blood pressure along with other symptoms, such as swelling and headaches.  Diagnosing and treating preeclampsia early is very important. If not treated early, it can cause serious problems for you and your baby.  Get help right away if you have symptoms that your health care provider told you to watch for. This information is not intended to replace advice given to you by your health care provider. Make sure you discuss any questions you have with your health care provider. Document Released: 01/31/2000 Document Revised: 01/19/2017 Document Reviewed: 09/09/2015 Elsevier Interactive Patient Education  2019 [employer].

## 2018-03-02 ENCOUNTER — Other Ambulatory Visit: Payer: Self-pay | Admitting: Obstetrics and Gynecology

## 2018-03-02 DIAGNOSIS — Z3A35 35 weeks gestation of pregnancy: Secondary | ICD-10-CM | POA: Diagnosis not present

## 2018-03-04 DIAGNOSIS — Z3A35 35 weeks gestation of pregnancy: Secondary | ICD-10-CM | POA: Diagnosis not present

## 2018-03-04 DIAGNOSIS — O10013 Pre-existing essential hypertension complicating pregnancy, third trimester: Secondary | ICD-10-CM | POA: Diagnosis not present

## 2018-03-08 DIAGNOSIS — Z3A36 36 weeks gestation of pregnancy: Secondary | ICD-10-CM | POA: Diagnosis not present

## 2018-03-08 DIAGNOSIS — O10013 Pre-existing essential hypertension complicating pregnancy, third trimester: Secondary | ICD-10-CM | POA: Diagnosis not present

## 2018-03-09 ENCOUNTER — Other Ambulatory Visit: Payer: Self-pay | Admitting: Obstetrics and Gynecology

## 2018-03-09 ENCOUNTER — Other Ambulatory Visit (HOSPITAL_COMMUNITY)
Admission: AD | Admit: 2018-03-09 | Discharge: 2018-03-09 | Disposition: A | Discharge status: Home / Self Care | Payer: Federal, State, Local not specified - PPO | Attending: Obstetrics and Gynecology | Admitting: Obstetrics and Gynecology

## 2018-03-09 DIAGNOSIS — O10013 Pre-existing essential hypertension complicating pregnancy, third trimester: Secondary | ICD-10-CM | POA: Diagnosis not present

## 2018-03-09 LAB — CBC WITH DIFFERENTIAL/PLATELET
BASOS ABS: 0 10*3/uL (ref 0.0–0.1)
BASOS PCT: 0 %
Eosinophils Absolute: 0 10*3/uL (ref 0.0–0.5)
Eosinophils Relative: 0 %
HEMATOCRIT: 39.2 % (ref 36.0–46.0)
HEMOGLOBIN: 13 g/dL (ref 12.0–15.0)
Lymphocytes Relative: 18 %
Lymphs Abs: 1.7 10*3/uL (ref 0.7–4.0)
MCH: 27.6 pg (ref 26.0–34.0)
MCHC: 33.2 g/dL (ref 30.0–36.0)
MCV: 83.2 fL (ref 80.0–100.0)
MONOS PCT: 5 %
Monocytes Absolute: 0.5 10*3/uL (ref 0.1–1.0)
NEUTROS ABS: 7.4 10*3/uL (ref 1.7–7.7)
NEUTROS PCT: 77 %
NRBC: 0 % (ref 0.0–0.2)
Platelets: 302 10*3/uL (ref 150–400)
RBC: 4.71 MIL/uL (ref 3.87–5.11)
RDW: 13.9 % (ref 11.5–15.5)
WBC: 9.7 10*3/uL (ref 4.0–10.5)

## 2018-03-09 LAB — COMPREHENSIVE METABOLIC PANEL
ALK PHOS: 100 U/L (ref 38–126)
ALT: 21 U/L (ref 0–44)
AST: 16 U/L (ref 15–41)
Albumin: 3.3 g/dL — ABNORMAL LOW (ref 3.5–5.0)
Anion gap: 8 (ref 5–15)
BILIRUBIN TOTAL: 0.7 mg/dL (ref 0.3–1.2)
BUN: 18 mg/dL (ref 6–20)
CALCIUM: 10 mg/dL (ref 8.9–10.3)
CO2: 21 mmol/L — AB (ref 22–32)
CREATININE: 1 mg/dL (ref 0.44–1.00)
Chloride: 104 mmol/L (ref 98–111)
GFR calc Af Amer: >60 mL/min (ref >60)
GFR calc non Af Amer: >60 mL/min (ref >60)
GLUCOSE: 103 mg/dL — AB (ref 70–99)
Potassium: 4.1 mmol/L (ref 3.5–5.1)
Sodium: 133 mmol/L — ABNORMAL LOW (ref 135–145)
Total Protein: 6.6 g/dL (ref 6.5–8.1)

## 2018-03-09 LAB — URIC ACID: Uric Acid, Serum: 9.3 mg/dL — ABNORMAL HIGH (ref 2.5–7.1)

## 2018-03-11 ENCOUNTER — Other Ambulatory Visit: Payer: Self-pay | Admitting: Obstetrics and Gynecology

## 2018-03-11 DIAGNOSIS — O288 Other abnormal findings on antenatal screening of mother: Secondary | ICD-10-CM | POA: Diagnosis not present

## 2018-03-11 DIAGNOSIS — Z3A36 36 weeks gestation of pregnancy: Secondary | ICD-10-CM | POA: Diagnosis not present

## 2018-03-11 DIAGNOSIS — O10013 Pre-existing essential hypertension complicating pregnancy, third trimester: Secondary | ICD-10-CM | POA: Diagnosis not present

## 2018-03-14 ENCOUNTER — Encounter (HOSPITAL_COMMUNITY)
Admission: RE | Admit: 2018-03-14 | Discharge: 2018-03-14 | Disposition: A | Discharge status: Home / Self Care | Payer: Federal, State, Local not specified - PPO | Source: Ambulatory Visit | Attending: Obstetrics and Gynecology | Admitting: Obstetrics and Gynecology

## 2018-03-14 ENCOUNTER — Encounter (HOSPITAL_COMMUNITY): Payer: Self-pay

## 2018-03-14 DIAGNOSIS — O1002 Pre-existing essential hypertension complicating childbirth: Secondary | ICD-10-CM | POA: Diagnosis not present

## 2018-03-14 DIAGNOSIS — O9081 Anemia of the puerperium: Secondary | ICD-10-CM | POA: Diagnosis not present

## 2018-03-14 DIAGNOSIS — O99214 Obesity complicating childbirth: Secondary | ICD-10-CM | POA: Diagnosis not present

## 2018-03-14 DIAGNOSIS — O322XX0 Maternal care for transverse and oblique lie, not applicable or unspecified: Secondary | ICD-10-CM | POA: Diagnosis not present

## 2018-03-14 DIAGNOSIS — O114 Pre-existing hypertension with pre-eclampsia, complicating childbirth: Secondary | ICD-10-CM | POA: Diagnosis not present

## 2018-03-14 DIAGNOSIS — O34211 Maternal care for low transverse scar from previous cesarean delivery: Secondary | ICD-10-CM | POA: Diagnosis not present

## 2018-03-14 DIAGNOSIS — D62 Acute posthemorrhagic anemia: Secondary | ICD-10-CM | POA: Diagnosis not present

## 2018-03-14 DIAGNOSIS — M1990 Unspecified osteoarthritis, unspecified site: Secondary | ICD-10-CM | POA: Diagnosis not present

## 2018-03-14 DIAGNOSIS — Z3A37 37 weeks gestation of pregnancy: Secondary | ICD-10-CM | POA: Diagnosis not present

## 2018-03-14 DIAGNOSIS — O151 Eclampsia in labor: Secondary | ICD-10-CM | POA: Diagnosis not present

## 2018-03-14 HISTORY — DX: Pilonidal cyst without abscess: L05.91

## 2018-03-14 HISTORY — DX: Supervision of pregnancy with other poor reproductive or obstetric history, unspecified trimester: O09.299

## 2018-03-14 LAB — COMPREHENSIVE METABOLIC PANEL
ALT: 20 U/L (ref 0–44)
AST: 19 U/L (ref 15–41)
Albumin: 3.4 g/dL — ABNORMAL LOW (ref 3.5–5.0)
Alkaline Phosphatase: 104 U/L (ref 38–126)
Anion gap: 9 (ref 5–15)
BUN: 21 mg/dL — ABNORMAL HIGH (ref 6–20)
CO2: 22 mmol/L (ref 22–32)
Calcium: 10 mg/dL (ref 8.9–10.3)
Chloride: 104 mmol/L (ref 98–111)
Creatinine, Ser: 1.08 mg/dL — ABNORMAL HIGH (ref 0.44–1.00)
GFR calc Af Amer: >60 mL/min (ref >60)
GFR calc non Af Amer: >60 mL/min (ref >60)
Glucose, Bld: 142 mg/dL — ABNORMAL HIGH (ref 70–99)
Potassium: 4.2 mmol/L (ref 3.5–5.1)
Sodium: 135 mmol/L (ref 135–145)
Total Bilirubin: 0.3 mg/dL (ref 0.3–1.2)
Total Protein: 7.5 g/dL (ref 6.5–8.1)

## 2018-03-14 LAB — CBC WITH DIFFERENTIAL/PLATELET
BASOS ABS: 0 10*3/uL (ref 0.0–0.1)
Basophils Relative: 0 %
EOS PCT: 1 %
Eosinophils Absolute: 0.1 10*3/uL (ref 0.0–0.5)
HCT: 40.1 % (ref 36.0–46.0)
Hemoglobin: 13.4 g/dL (ref 12.0–15.0)
LYMPHS PCT: 16 %
Lymphs Abs: 1.5 10*3/uL (ref 0.7–4.0)
MCH: 28.1 pg (ref 26.0–34.0)
MCHC: 33.4 g/dL (ref 30.0–36.0)
MCV: 84.1 fL (ref 80.0–100.0)
Monocytes Absolute: 0.4 10*3/uL (ref 0.1–1.0)
Monocytes Relative: 4 %
Neutro Abs: 7.7 10*3/uL (ref 1.7–7.7)
Neutrophils Relative %: 79 %
Platelets: 288 10*3/uL (ref 150–400)
RBC: 4.77 MIL/uL (ref 3.87–5.11)
RDW: 14 % (ref 11.5–15.5)
WBC: 9.7 10*3/uL (ref 4.0–10.5)
nRBC: 0 % (ref 0.0–0.2)

## 2018-03-14 LAB — TYPE AND SCREEN
ABO/RH(D): B POS
Antibody Screen: NEGATIVE

## 2018-03-14 LAB — URIC ACID: Uric Acid, Serum: 9.7 mg/dL — ABNORMAL HIGH (ref 2.5–7.1)

## 2018-03-14 NOTE — Patient Instructions (Signed)
KATARZYNA WOLVEN  03/14/2018   Your procedure is scheduled on:  03/15/2018  Enter through the Main Entrance of [facility] at [place] up the phone at the desk and dial [phone]  Call this number if you have problems the morning of surgery:[phone]  Remember:   Do not eat food:(After Midnight) Desps de medianoche.  Do not drink clear liquids: (6 Hours before arrival) 6 horas ante llegada.  Take these medicines the morning of surgery with A SIP OF WATER: take labetalol and procardia as prescribed   Do not wear jewelry, make-up or nail polish.  Do not wear lotions, powders, or perfumes. Do not wear deodorant.  Do not shave 48 hours prior to surgery.  Do not bring valuables to the hospital.  [facility] is not   responsible for any belongings or valuables brought to the hospital.  Contacts, dentures or bridgework may not be worn into surgery.  Leave suitcase in the car. After surgery it may be brought to your room.  For patients admitted to the hospital, checkout time is 11:00 AM the day of              discharge.    N/A   Please read over the following fact sheets that you were given:   Surgical Site Infection Prevention

## 2018-03-14 NOTE — Anesthesia Preprocedure Evaluation (Addendum)
Anesthesia Evaluation  Patient identified by MRN, date of birth, ID band Patient awake    Reviewed: Allergy & Precautions, NPO status , Patient's Chart, lab work & pertinent test results  Airway Mallampati: III  TM Distance: >3 FB Neck ROM: Full    Dental no notable dental hx. (+) Teeth Intact   Pulmonary neg pulmonary ROS,    Pulmonary exam normal breath sounds clear to auscultation       Cardiovascular hypertension, Pt. on medications Normal cardiovascular exam Rhythm:Regular Rate:Normal     Neuro/Psych negative neurological ROS  negative psych ROS   GI/Hepatic negative GI ROS, Neg liver ROS,   Endo/Other  Morbid obesity  Renal/GU negative Renal ROS     Musculoskeletal   Abdominal (+) + obese,   Peds  Hematology Hgb 13.4,  Plts 288   Anesthesia Other Findings   Reproductive/Obstetrics                            Anesthesia Physical Anesthesia Plan  ASA: III  Anesthesia Plan: Spinal   Post-op Pain Management:    Induction: Intravenous  PONV Risk Score and Plan: 3 and Treatment may vary due to age or medical condition  Airway Management Planned: Natural Airway  Additional Equipment:   Intra-op Plan:   Post-operative Plan:   Informed Consent: I have reviewed the patients History and Physical, chart, labs and discussed the procedure including the risks, benefits and alternatives for the proposed anesthesia with the patient or authorized representative who has indicated his/her understanding and acceptance.     Dental advisory given  Plan Discussed with:   Anesthesia Plan Comments: (C/s for PIH on Superimposed chronic HTN)      Anesthesia Quick Evaluation

## 2018-03-15 ENCOUNTER — Other Ambulatory Visit: Payer: Self-pay

## 2018-03-15 ENCOUNTER — Inpatient Hospital Stay (HOSPITAL_COMMUNITY): Payer: Federal, State, Local not specified - PPO | Admitting: Anesthesiology

## 2018-03-15 ENCOUNTER — Encounter (HOSPITAL_COMMUNITY): Payer: Self-pay | Admitting: Obstetrics and Gynecology

## 2018-03-15 ENCOUNTER — Inpatient Hospital Stay (HOSPITAL_COMMUNITY)
Admission: RE | Admit: 2018-03-15 | Discharge: 2018-03-17 | DRG: 786 | Disposition: A | Discharge status: Home / Self Care | Payer: Federal, State, Local not specified - PPO | Attending: Obstetrics and Gynecology | Admitting: Obstetrics and Gynecology

## 2018-03-15 ENCOUNTER — Encounter (HOSPITAL_COMMUNITY)
Admission: RE | Disposition: A | Discharge status: Home / Self Care | Payer: Self-pay | Source: Home / Self Care | Attending: Obstetrics and Gynecology

## 2018-03-15 DIAGNOSIS — O322XX0 Maternal care for transverse and oblique lie, not applicable or unspecified: Secondary | ICD-10-CM | POA: Diagnosis present

## 2018-03-15 DIAGNOSIS — O119 Pre-existing hypertension with pre-eclampsia, unspecified trimester: Secondary | ICD-10-CM | POA: Diagnosis present

## 2018-03-15 DIAGNOSIS — O151 Eclampsia in labor: Secondary | ICD-10-CM | POA: Diagnosis not present

## 2018-03-15 DIAGNOSIS — O1493 Unspecified pre-eclampsia, third trimester: Secondary | ICD-10-CM | POA: Diagnosis not present

## 2018-03-15 DIAGNOSIS — M1990 Unspecified osteoarthritis, unspecified site: Secondary | ICD-10-CM | POA: Diagnosis present

## 2018-03-15 DIAGNOSIS — O99214 Obesity complicating childbirth: Secondary | ICD-10-CM | POA: Diagnosis present

## 2018-03-15 DIAGNOSIS — D62 Acute posthemorrhagic anemia: Secondary | ICD-10-CM | POA: Diagnosis not present

## 2018-03-15 DIAGNOSIS — O9081 Anemia of the puerperium: Secondary | ICD-10-CM | POA: Diagnosis not present

## 2018-03-15 DIAGNOSIS — Z412 Encounter for routine and ritual male circumcision: Secondary | ICD-10-CM | POA: Diagnosis not present

## 2018-03-15 DIAGNOSIS — O1002 Pre-existing essential hypertension complicating childbirth: Secondary | ICD-10-CM | POA: Diagnosis present

## 2018-03-15 DIAGNOSIS — O34211 Maternal care for low transverse scar from previous cesarean delivery: Principal | ICD-10-CM | POA: Diagnosis present

## 2018-03-15 DIAGNOSIS — O114 Pre-existing hypertension with pre-eclampsia, complicating childbirth: Secondary | ICD-10-CM | POA: Diagnosis not present

## 2018-03-15 DIAGNOSIS — Z3A37 37 weeks gestation of pregnancy: Secondary | ICD-10-CM

## 2018-03-15 DIAGNOSIS — Z98891 History of uterine scar from previous surgery: Secondary | ICD-10-CM

## 2018-03-15 DIAGNOSIS — O1503 Eclampsia in pregnancy, third trimester: Secondary | ICD-10-CM | POA: Diagnosis not present

## 2018-03-15 DIAGNOSIS — O1494 Unspecified pre-eclampsia, complicating childbirth: Secondary | ICD-10-CM | POA: Diagnosis not present

## 2018-03-15 HISTORY — DX: Eclampsia complicating pregnancy, third trimester: O15.03

## 2018-03-15 HISTORY — DX: Pre-existing hypertension with pre-eclampsia, unspecified trimester: O11.9

## 2018-03-15 LAB — COMPREHENSIVE METABOLIC PANEL
ALT: 22 U/L (ref 0–44)
AST: 20 U/L (ref 15–41)
Albumin: 3.3 g/dL — ABNORMAL LOW (ref 3.5–5.0)
Alkaline Phosphatase: 90 U/L (ref 38–126)
Anion gap: 8 (ref 5–15)
BUN: 24 mg/dL — ABNORMAL HIGH (ref 6–20)
CHLORIDE: 106 mmol/L (ref 98–111)
CO2: 22 mmol/L (ref 22–32)
Calcium: 9.5 mg/dL (ref 8.9–10.3)
Creatinine, Ser: 1.04 mg/dL — ABNORMAL HIGH (ref 0.44–1.00)
GFR calc Af Amer: >60 mL/min (ref >60)
GFR calc non Af Amer: >60 mL/min (ref >60)
Glucose, Bld: 88 mg/dL (ref 70–99)
Potassium: 4.6 mmol/L (ref 3.5–5.1)
SODIUM: 136 mmol/L (ref 135–145)
Total Bilirubin: 0.5 mg/dL (ref 0.3–1.2)
Total Protein: 6.9 g/dL (ref 6.5–8.1)

## 2018-03-15 LAB — URIC ACID: URIC ACID, SERUM: 9.9 mg/dL — AB (ref 2.5–7.1)

## 2018-03-15 LAB — RPR: RPR: NONREACTIVE

## 2018-03-15 LAB — MAGNESIUM: Magnesium: 3.8 mg/dL — ABNORMAL HIGH (ref 1.7–2.4)

## 2018-03-15 SURGERY — Surgical Case
Anesthesia: Spinal

## 2018-03-15 MED ORDER — NIFEDIPINE ER OSMOTIC RELEASE 30 MG PO TB24
60.0000 mg | ORAL_TABLET | Freq: Every day | ORAL | Status: DC
  Administered 2018-03-16 – 2018-03-17 (×2): 60 mg via ORAL
  Filled 2018-03-15 (×3): qty 2

## 2018-03-15 MED ORDER — SODIUM CHLORIDE 0.9% FLUSH: 3.0000 mL | INTRAVENOUS | Status: DC | PRN

## 2018-03-15 MED ORDER — LACTATED RINGERS IV SOLN
INTRAVENOUS | Status: DC
  Administered 2018-03-15 (×2): via INTRAVENOUS

## 2018-03-15 MED ORDER — DIPHENHYDRAMINE HCL 50 MG/ML IJ SOLN: 12.5000 mg | INTRAMUSCULAR | Status: DC | PRN

## 2018-03-15 MED ORDER — MAGNESIUM SULFATE 40 G IN LACTATED RINGERS - SIMPLE
INTRAVENOUS | Status: DC | PRN
  Administered 2018-03-15: 4 g via INTRAVENOUS

## 2018-03-15 MED ORDER — MAGNESIUM SULFATE 40 G IN LACTATED RINGERS - SIMPLE
2.0000 g/h | INTRAVENOUS | Status: DC
  Administered 2018-03-15: 2 g/h via INTRAVENOUS
  Filled 2018-03-15: qty 500

## 2018-03-15 MED ORDER — DEXTROSE 5 % IV SOLN
INTRAVENOUS | Status: DC | PRN
  Administered 2018-03-15: 3 g via INTRAVENOUS

## 2018-03-15 MED ORDER — PROMETHAZINE HCL 25 MG/ML IJ SOLN: 6.2500 mg | INTRAMUSCULAR | Status: DC | PRN

## 2018-03-15 MED ORDER — MEPERIDINE HCL 25 MG/ML IJ SOLN: 6.2500 mg | INTRAMUSCULAR | Status: DC | PRN

## 2018-03-15 MED ORDER — MENTHOL 3 MG MT LOZG: 1.0000 | LOZENGE | OROMUCOSAL | Status: DC | PRN

## 2018-03-15 MED ORDER — NALOXONE HCL 0.4 MG/ML IJ SOLN: 0.4000 mg | INTRAMUSCULAR | Status: DC | PRN

## 2018-03-15 MED ORDER — NALBUPHINE HCL 10 MG/ML IJ SOLN: 5.0000 mg | Freq: Once | INTRAMUSCULAR | Status: DC | PRN

## 2018-03-15 MED ORDER — COCONUT OIL OIL: 1.0000 "application " | TOPICAL_OIL | Status: DC | PRN

## 2018-03-15 MED ORDER — DIBUCAINE 1 % RE OINT: 1.0000 "application " | TOPICAL_OINTMENT | RECTAL | Status: DC | PRN

## 2018-03-15 MED ORDER — MAGNESIUM SULFATE 40 G IN LACTATED RINGERS - SIMPLE
2.0000 g/h | INTRAVENOUS | Status: AC
  Administered 2018-03-15 – 2018-03-16 (×2): 2 g/h via INTRAVENOUS
  Filled 2018-03-15 (×2): qty 500

## 2018-03-15 MED ORDER — HYDROMORPHONE HCL 1 MG/ML IJ SOLN: 0.2500 mg | INTRAMUSCULAR | Status: DC | PRN

## 2018-03-15 MED ORDER — OXYTOCIN 40 UNITS IN NORMAL SALINE INFUSION - SIMPLE MED: 2.5000 [IU]/h | INTRAVENOUS | Status: DC

## 2018-03-15 MED ORDER — MORPHINE SULFATE (PF) 0.5 MG/ML IJ SOLN
INTRAMUSCULAR | Status: DC | PRN
  Administered 2018-03-15: .15 mg via INTRATHECAL

## 2018-03-15 MED ORDER — SIMETHICONE 80 MG PO CHEW: 80.0000 mg | CHEWABLE_TABLET | ORAL | Status: DC | PRN

## 2018-03-15 MED ORDER — ALBUMIN HUMAN 5 % IV SOLN
INTRAVENOUS | Status: AC
  Filled 2018-03-15: qty 500

## 2018-03-15 MED ORDER — KETOROLAC TROMETHAMINE 30 MG/ML IJ SOLN
30.0000 mg | Freq: Four times a day (QID) | INTRAMUSCULAR | Status: DC | PRN
  Administered 2018-03-15: 30 mg via INTRAMUSCULAR

## 2018-03-15 MED ORDER — DIPHENHYDRAMINE HCL 25 MG PO CAPS
25.0000 mg | ORAL_CAPSULE | ORAL | Status: DC | PRN
  Filled 2018-03-15: qty 1

## 2018-03-15 MED ORDER — SENNOSIDES-DOCUSATE SODIUM 8.6-50 MG PO TABS
2.0000 | ORAL_TABLET | ORAL | Status: DC
  Administered 2018-03-15 – 2018-03-16 (×2): 2 via ORAL
  Filled 2018-03-15 (×2): qty 2

## 2018-03-15 MED ORDER — NALOXONE HCL 4 MG/10ML IJ SOLN
1.0000 ug/kg/h | INTRAVENOUS | Status: DC | PRN
  Filled 2018-03-15: qty 5

## 2018-03-15 MED ORDER — WITCH HAZEL-GLYCERIN EX PADS: 1.0000 "application " | MEDICATED_PAD | CUTANEOUS | Status: DC | PRN

## 2018-03-15 MED ORDER — MAGNESIUM SULFATE BOLUS VIA INFUSION
4.0000 g | Freq: Once | INTRAVENOUS | Status: DC
  Filled 2018-03-15: qty 500

## 2018-03-15 MED ORDER — HYDROCODONE-ACETAMINOPHEN 7.5-325 MG PO TABS: 1.0000 | ORAL_TABLET | Freq: Once | ORAL | Status: DC | PRN

## 2018-03-15 MED ORDER — OXYTOCIN 10 UNIT/ML IJ SOLN
INTRAVENOUS | Status: DC | PRN
  Administered 2018-03-15: 40 [IU] via INTRAVENOUS

## 2018-03-15 MED ORDER — LACTATED RINGERS IV SOLN
INTRAVENOUS | Status: DC | PRN
  Administered 2018-03-15: 14:00:00 via INTRAVENOUS

## 2018-03-15 MED ORDER — IBUPROFEN 800 MG PO TABS
800.0000 mg | ORAL_TABLET | Freq: Three times a day (TID) | ORAL | Status: DC
  Filled 2018-03-15: qty 1

## 2018-03-15 MED ORDER — LACTATED RINGERS IV SOLN
INTRAVENOUS | Status: DC
  Administered 2018-03-15 – 2018-03-16 (×2): via INTRAVENOUS

## 2018-03-15 MED ORDER — ONDANSETRON HCL 4 MG/2ML IJ SOLN
INTRAMUSCULAR | Status: AC
  Filled 2018-03-15: qty 2

## 2018-03-15 MED ORDER — FENTANYL CITRATE (PF) 100 MCG/2ML IJ SOLN
INTRAMUSCULAR | Status: DC | PRN
  Administered 2018-03-15: 15 ug via INTRATHECAL

## 2018-03-15 MED ORDER — SIMETHICONE 80 MG PO CHEW
80.0000 mg | CHEWABLE_TABLET | Freq: Three times a day (TID) | ORAL | Status: DC
  Administered 2018-03-16 – 2018-03-17 (×3): 80 mg via ORAL
  Filled 2018-03-15 (×3): qty 1

## 2018-03-15 MED ORDER — CEFAZOLIN SODIUM-DEXTROSE 2-4 GM/100ML-% IV SOLN: 2.0000 g | INTRAVENOUS | Status: DC

## 2018-03-15 MED ORDER — CEFAZOLIN SODIUM-DEXTROSE 2-4 GM/100ML-% IV SOLN
2.0000 g | Freq: Three times a day (TID) | INTRAVENOUS | Status: AC
  Administered 2018-03-15 – 2018-03-16 (×3): 2 g via INTRAVENOUS
  Filled 2018-03-15 (×3): qty 100

## 2018-03-15 MED ORDER — BUPIVACAINE IN DEXTROSE 0.75-8.25 % IT SOLN
INTRATHECAL | Status: DC | PRN
  Administered 2018-03-15: 1.5 mg via INTRATHECAL

## 2018-03-15 MED ORDER — ONDANSETRON HCL 4 MG/2ML IJ SOLN: 4.0000 mg | Freq: Three times a day (TID) | INTRAMUSCULAR | Status: DC | PRN

## 2018-03-15 MED ORDER — STERILE WATER FOR IRRIGATION IR SOLN
Status: DC | PRN
  Administered 2018-03-15: 1000 mL

## 2018-03-15 MED ORDER — KETOROLAC TROMETHAMINE 30 MG/ML IJ SOLN: 30.0000 mg | Freq: Four times a day (QID) | INTRAMUSCULAR | Status: DC | PRN

## 2018-03-15 MED ORDER — PHENYLEPHRINE 8 MG IN D5W 100 ML (0.08MG/ML) PREMIX OPTIME
INJECTION | INTRAVENOUS | Status: AC
  Filled 2018-03-15: qty 100

## 2018-03-15 MED ORDER — PHENYLEPHRINE 8 MG IN D5W 100 ML (0.08MG/ML) PREMIX OPTIME
INJECTION | INTRAVENOUS | Status: DC | PRN
  Administered 2018-03-15: 60 ug/min via INTRAVENOUS

## 2018-03-15 MED ORDER — ACETAMINOPHEN 10 MG/ML IV SOLN: 1000.0000 mg | Freq: Once | INTRAVENOUS | Status: DC | PRN

## 2018-03-15 MED ORDER — ALBUMIN HUMAN 5 % IV SOLN
INTRAVENOUS | Status: DC | PRN
  Administered 2018-03-15 (×2): via INTRAVENOUS

## 2018-03-15 MED ORDER — ZOLPIDEM TARTRATE 5 MG PO TABS: 5.0000 mg | ORAL_TABLET | Freq: Every evening | ORAL | Status: DC | PRN

## 2018-03-15 MED ORDER — KETOROLAC TROMETHAMINE 30 MG/ML IJ SOLN
INTRAMUSCULAR | Status: AC
  Filled 2018-03-15: qty 1

## 2018-03-15 MED ORDER — FENTANYL CITRATE (PF) 100 MCG/2ML IJ SOLN
INTRAMUSCULAR | Status: AC
  Filled 2018-03-15: qty 2

## 2018-03-15 MED ORDER — OXYCODONE HCL 5 MG PO TABS: 5.0000 mg | ORAL_TABLET | ORAL | Status: DC | PRN

## 2018-03-15 MED ORDER — MAGNESIUM SULFATE BOLUS VIA INFUSION
2.0000 g | Freq: Once | INTRAVENOUS | Status: AC
  Administered 2018-03-15: 2 g via INTRAVENOUS
  Filled 2018-03-15: qty 500

## 2018-03-15 MED ORDER — MORPHINE SULFATE (PF) 0.5 MG/ML IJ SOLN
INTRAMUSCULAR | Status: AC
  Filled 2018-03-15: qty 10

## 2018-03-15 MED ORDER — NALBUPHINE HCL 10 MG/ML IJ SOLN: 5.0000 mg | INTRAMUSCULAR | Status: DC | PRN

## 2018-03-15 MED ORDER — SCOPOLAMINE 1 MG/3DAYS TD PT72
1.0000 | MEDICATED_PATCH | Freq: Once | TRANSDERMAL | Status: DC
  Administered 2018-03-15: 1.5 mg via TRANSDERMAL
  Filled 2018-03-15: qty 1

## 2018-03-15 MED ORDER — LABETALOL HCL 200 MG PO TABS
400.0000 mg | ORAL_TABLET | Freq: Two times a day (BID) | ORAL | Status: DC
  Administered 2018-03-15 – 2018-03-17 (×4): 400 mg via ORAL
  Filled 2018-03-15 (×2): qty 2
  Filled 2018-03-15: qty 4
  Filled 2018-03-15: qty 2

## 2018-03-15 MED ORDER — SIMETHICONE 80 MG PO CHEW
80.0000 mg | CHEWABLE_TABLET | ORAL | Status: DC
  Administered 2018-03-15 – 2018-03-16 (×2): 80 mg via ORAL
  Filled 2018-03-15 (×2): qty 1

## 2018-03-15 MED ORDER — DIPHENHYDRAMINE HCL 25 MG PO CAPS
25.0000 mg | ORAL_CAPSULE | Freq: Four times a day (QID) | ORAL | Status: DC | PRN
  Administered 2018-03-15: 25 mg via ORAL
  Filled 2018-03-15: qty 1

## 2018-03-15 MED ORDER — ONDANSETRON HCL 4 MG/2ML IJ SOLN
INTRAMUSCULAR | Status: DC | PRN
  Administered 2018-03-15: 4 mg via INTRAVENOUS

## 2018-03-15 MED ORDER — PRENATAL MULTIVITAMIN CH: 1.0000 | ORAL_TABLET | Freq: Every day | ORAL | Status: DC

## 2018-03-15 MED ORDER — SODIUM CHLORIDE 0.9 % IR SOLN
Status: DC | PRN
  Administered 2018-03-15: 1

## 2018-03-15 MED ORDER — OXYTOCIN 10 UNIT/ML IJ SOLN
INTRAMUSCULAR | Status: AC
  Filled 2018-03-15: qty 4

## 2018-03-15 MED ORDER — BUPIVACAINE HCL (PF) 0.25 % IJ SOLN
INTRAMUSCULAR | Status: AC
  Filled 2018-03-15: qty 10

## 2018-03-15 MED ORDER — CEFAZOLIN SODIUM-DEXTROSE 2-4 GM/100ML-% IV SOLN
2.0000 g | Freq: Three times a day (TID) | INTRAVENOUS | Status: DC
  Filled 2018-03-15 (×3): qty 100

## 2018-03-15 SURGICAL SUPPLY — 45 items
BARRIER ADHS 3X4 INTERCEED (GAUZE/BANDAGES/DRESSINGS) ×2 IMPLANT
BENZOIN TINCTURE PRP APPL 2/3 (GAUZE/BANDAGES/DRESSINGS) IMPLANT
CHLORAPREP W/TINT 26ML (MISCELLANEOUS) ×2 IMPLANT
CLAMP CORD UMBIL (MISCELLANEOUS) IMPLANT
CLOTH BEACON ORANGE TIMEOUT ST (SAFETY) ×2 IMPLANT
DRAPE C SECTION CLR SCREEN (DRAPES) ×2 IMPLANT
DRESSING PREVENA PLUS CUSTOM (GAUZE/BANDAGES/DRESSINGS) ×1 IMPLANT
DRSG OPSITE POSTOP 4X10 (GAUZE/BANDAGES/DRESSINGS) ×2 IMPLANT
DRSG PREVENA PLUS CUSTOM (GAUZE/BANDAGES/DRESSINGS) ×2
ELECT REM PT RETURN 9FT ADLT (ELECTROSURGICAL) ×2
ELECTRODE REM PT RTRN 9FT ADLT (ELECTROSURGICAL) ×1 IMPLANT
EXTRACTOR VACUUM M CUP 4 TUBE (SUCTIONS) IMPLANT
GLOVE BIOGEL PI IND STRL 7.0 (GLOVE) ×2 IMPLANT
GLOVE BIOGEL PI INDICATOR 7.0 (GLOVE) ×2
GLOVE ECLIPSE 6.5 STRL STRAW (GLOVE) ×2 IMPLANT
GOWN STRL REUS W/TWL LRG LVL3 (GOWN DISPOSABLE) ×4 IMPLANT
KIT ABG SYR 3ML LUER SLIP (SYRINGE) IMPLANT
NEEDLE HYPO 22GX1.5 SAFETY (NEEDLE) ×2 IMPLANT
NEEDLE HYPO 25X5/8 SAFETYGLIDE (NEEDLE) IMPLANT
NS IRRIG 1000ML POUR BTL (IV SOLUTION) ×2 IMPLANT
PACK C SECTION WH (CUSTOM PROCEDURE TRAY) ×2 IMPLANT
PAD OB MATERNITY 4.3X12.25 (PERSONAL CARE ITEMS) ×2 IMPLANT
RTRCTR C-SECT PINK 25CM LRG (MISCELLANEOUS) IMPLANT
STAPLER VISISTAT 35W (STAPLE) ×2 IMPLANT
STRIP CLOSURE SKIN 1/2X4 (GAUZE/BANDAGES/DRESSINGS) IMPLANT
SUT CHROMIC GUT AB #0 18 (SUTURE) IMPLANT
SUT MNCRL 0 VIOLET CTX 36 (SUTURE) ×3 IMPLANT
SUT MON AB 2-0 SH 27 (SUTURE)
SUT MON AB 2-0 SH27 (SUTURE) IMPLANT
SUT MON AB 3-0 SH 27 (SUTURE)
SUT MON AB 3-0 SH27 (SUTURE) IMPLANT
SUT MON AB 4-0 PS1 27 (SUTURE) IMPLANT
SUT MONOCRYL 0 CTX 36 (SUTURE) ×3
SUT PLAIN 2 0 (SUTURE)
SUT PLAIN 2 0 XLH (SUTURE) IMPLANT
SUT PLAIN ABS 2-0 CT1 27XMFL (SUTURE) IMPLANT
SUT VIC AB 0 CT1 36 (SUTURE) ×4 IMPLANT
SUT VIC AB 2-0 CT1 27 (SUTURE) ×1
SUT VIC AB 2-0 CT1 TAPERPNT 27 (SUTURE) ×1 IMPLANT
SUT VIC AB 4-0 KS 27 (SUTURE) ×2 IMPLANT
SUT VIC AB 4-0 PS2 27 (SUTURE) IMPLANT
SYR CONTROL 10ML LL (SYRINGE) ×2 IMPLANT
TOWEL OR 17X24 6PK STRL BLUE (TOWEL DISPOSABLE) ×2 IMPLANT
TRAY FOLEY W/BAG SLVR 14FR LF (SET/KITS/TRAYS/PACK) IMPLANT
YANKAUER SUCT BULB TIP NO VENT (SUCTIONS) ×2 IMPLANT

## 2018-03-15 NOTE — Transfer of Care (Signed)
Immediate Anesthesia Transfer of Care Note  [facility]  Procedure(s) Performed: Repeat CESAREAN SECTION (N/A )  Patient Location: PACU  Anesthesia Type:Spinal  Level of Consciousness: awake, alert , oriented and patient cooperative  Airway & Oxygen Therapy: Patient Spontanous Breathing  Post-op Assessment: Report given to RN and Post -op Vital signs reviewed and stable  Post vital signs: Reviewed and stable  Last Vitals:  Vitals Value Taken Time  BP 126/75 03/15/2018  2:56 PM  Temp    Pulse 63 03/15/2018  2:58 PM  Resp 15 03/15/2018  2:57 PM  SpO2 99 % 03/15/2018  2:58 PM  Vitals shown include unvalidated device data.  Last Pain:  Vitals:   03/15/18 1154  TempSrc: Oral         Complications: No apparent anesthesia complications, Phenylephrine infusion infusing via pump, Magnesium 2 grams infusion via infusing via pump

## 2018-03-15 NOTE — Progress Notes (Signed)
Dr. Garwin Brothers notified of Magnesium level being 3.8. Orders for a 2g bolus and then continue 2g a hour.

## 2018-03-15 NOTE — Anesthesia Procedure Notes (Signed)
Spinal  Patient location during procedure: OB Start time: 03/15/2018 1:35 PM End time: 03/15/2018 1:39 PM Staffing Anesthesiologist: Barnet Glasgow, MD Performed: anesthesiologist  Preanesthetic Checklist Completed: patient identified, surgical consent, pre-op evaluation, timeout performed, IV checked, risks and benefits discussed and monitors and equipment checked Spinal Block Patient position: sitting Prep: site prepped and draped and DuraPrep Patient monitoring: heart rate, cardiac monitor, continuous pulse ox and blood pressure Approach: midline Location: L3-4 Injection technique: single-shot Needle Needle type: Pencan  Needle gauge: 24 G Needle length: 10 cm Needle insertion depth: 7 cm Assessment Sensory level: T4 Additional Notes 1 Attempt (s). Pt tolerated procedure well.

## 2018-03-15 NOTE — H&P (Signed)
Kylie Robertson is a 43 y.o. female presenting for  Repeat Cesarean section due to chronic HTN with superimposed preeclampsia. Pt is on labetalol and procardia.  OB History    Gravida  3   Para  1   Term  1   Preterm      AB  1   Living  1     SAB      TAB  1   Ectopic      Multiple  0   Live Births  1          Past Medical History:  Diagnosis Date  . ASCUS (atypical squamous cells of undetermined significance) on Pap smear   . Chronic hypertension with superimposed preeclampsia 03/15/2018  . Endocervical polyp   . HSV-2 (herpes simplex virus 2) infection   . Hx of pre-eclampsia in prior pregnancy, currently pregnant   . Hypertension   . Osteoarthritis   . Pilonidal cyst   . Vaginal Pap smear, abnormal    Past Surgical History:  Procedure Laterality Date  . CESAREAN SECTION N/A 02/12/2017   Procedure: Primary CESAREAN SECTION;  Surgeon: Servando Salina, MD;  Location: [place];  Service: Obstetrics;  Laterality: N/A;  EDD: 02/19/17  . HIP SURGERY    . WISDOM TOOTH EXTRACTION     Family History: family history includes Breast cancer in her mother; Diabetes in her maternal grandfather, maternal grandmother, paternal grandfather, and paternal grandmother; Drug abuse in her father; Heart attack in her father; Hypertension in her maternal grandfather, maternal grandmother, paternal grandfather, and paternal grandmother; Kidney disease in her maternal grandmother. Social History:  reports that she has never smoked. She has never used smokeless tobacco. She reports current alcohol use. She reports that she does not use drugs.     Maternal Diabetes: No Genetic Screening: Normal Maternal Ultrasounds/Referrals: Abnormal:  Findings:   Isolated EIF (echogenic intracardiac focus) Fetal Ultrasounds or other Referrals:  None Maternal Substance Abuse:  No Significant Maternal Medications:  None Significant Maternal Lab Results:  Lab values include:  Group B Strep negative Other Comments:  chronic HTn. BMZ complete  Review of Systems  Eyes: Negative for blurred vision.  Cardiovascular: Positive for leg swelling.  Gastrointestinal: Negative for heartburn.  Neurological: Negative for headaches.  All other systems reviewed and are negative.  History   Last menstrual period 06/29/2017, unknown if currently breastfeeding. Exam Physical Exam  Constitutional: She is oriented to person, place, and time. She appears well-developed and well-nourished.  HENT:  Head: Atraumatic.  Eyes: EOM are normal.  Neck: Neck supple.  Cardiovascular: Normal rate and regular rhythm.  Respiratory: Effort normal.  Pfannensteil scar, gravid  GI: Soft.  Musculoskeletal:        General: Edema present.  Neurological: She is alert and oriented to person, place, and time.  Skin: Skin is warm and dry.  Psychiatric: She has a normal mood and affect.    Prenatal labs: ABO, Rh: --/--/B POS (01/27 1210) Antibody: NEG (01/27 1210) Rubella: Immune (07/17 0000) RPR: Nonreactive (07/17 0000)  HBsAg: Negative (07/17 0000)  HIV: Non-reactive (07/17 0000)  GBS: Negative (01/14 0000)  CBC Latest Ref Rng & Units 03/14/2018 03/09/2018 03/01/2018  WBC 4.0 - 10.5 K/uL 9.7 9.7 7.6  Hemoglobin 12.0 - 15.0 g/dL 13.4 13.0 11.6(L)  Hematocrit 36.0 - 46.0 % 40.1 39.2 35.3(L)  Platelets 150 - 400 K/uL 288 302 299   CMP Latest Ref Rng & Units 03/14/2018 03/09/2018 03/01/2018  Glucose 70 -  99 mg/dL 142(H) 103(H) 84  BUN 6 - 20 mg/dL 21(H) 18 11  Creatinine 0.44 - 1.00 mg/dL 1.08(H) 1.00 0.76  Sodium 135 - 145 mmol/L 135 133(L) 134(L)  Potassium 3.5 - 5.1 mmol/L 4.2 4.1 4.2  Chloride 98 - 111 mmol/L 104 104 106  CO2 22 - 32 mmol/L 22 21(L) 20(L)  Calcium 8.9 - 10.3 mg/dL 10.0 10.0 9.2  Total Protein 6.5 - 8.1 g/dL 7.5 6.6 6.7  Total Bilirubin 0.3 - 1.2 mg/dL 0.3 0.7 0.7  Alkaline Phos 38 - 126 U/L 104 100 92  AST 15 - 41 U/L 19 16 17   ALT 0 - 44 U/L 20 21 18   uric acid  9.7   Assessment/Plan: Chronic HTN with superimposed preeclampsia IUP@ 37 weeks Previous C/S Other osteoarthritis AMA P) repeat C/S. Risk of surgery including infection, bleeding, injury to bladder, bowel, ureter, internal scar tissue, poss need for blood transfusion and its risk( HIV, hepatitis, acute rxn).  Cont with labetalol, procardia. Magnesium post delivery. All ? answered  Tamon Parkerson A Kima Malenfant 03/15/2018, 2:16 AM

## 2018-03-15 NOTE — Brief Op Note (Signed)
03/15/2018  2:45 PM  PATIENT:  Kylie Robertson  43 y.o. female  PRE-OPERATIVE DIAGNOSIS:  Previous Cesarean Section, Chronic Hypertension, with Superimposed preeclampsia, osteoarthritis, IUP @ 37 weeks  POST-OPERATIVE DIAGNOSIS:   Chronic HTN with  Superimposed Eclampsia; Previous c-section, IUP @ 37 weeks, osteoarthritis  PROCEDURE:  Emergency repeat cesarean section, kerr hysterotomy  SURGEON:  Surgeon(s) and Role:    * Servando Salina, MD - Primary  PHYSICIAN ASSISTANT:   ASSISTANTS: Lars Pinks, MD  Azucena Fallen, MD  ANESTHESIA:   spinal Findings: eclampsia after spinal anesthesia. Floppy live female transverse lie back down, nl tubes and ovaries. Cord ph done EBL:  300 mL   BLOOD ADMINISTERED:none  DRAINS: none   LOCAL MEDICATIONS USED:  NONE  SPECIMEN:  Source of Specimen:  placenta  DISPOSITION OF SPECIMEN:  PATHOLOGY  COUNTS:  YES  TOURNIQUET:  * No tourniquets in log *  DICTATION: .Other Dictation: Dictation Number [id number]  PLAN OF CARE: Admit to inpatient   PATIENT DISPOSITION:  PACU - hemodynamically stable.   Delay start of Pharmacological VTE agent (>24hrs) due to surgical blood loss or risk of bleeding: no

## 2018-03-15 NOTE — Op Note (Signed)
NAME: Kylie Robertson, Kylie Robertson MEDICAL RECORD [id number] ACCOUNT [id number] DATE OF BIRTH:July 30, 1975 FACILITY: [facility] LOCATION: [id number] PHYSICIAN:Selenne Coggin A. Mccall Will, MD  OPERATIVE REPORT  DATE OF PROCEDURE:  03/15/2018  PREOPERATIVE DIAGNOSES:  Chronic hypertension with superimposed preeclampsia, osteoarthritis, previous cesarean section, intrauterine gestation at 37 weeks, eclamptic seizure.  PROCEDURE:  Emergency repeat cesarean section, Kerr hysterotomy.  POSTOPERATIVE DIAGNOSES:  Eclamptic seizure, chronic hypertension, previous cesarean section, osteoarthritis, intrauterine gestation at 37 weeks.  ANESTHESIA:  Spinal.  SURGEON:  Servando Salina, MD  ASSISTANT:  [place] Laroche, CNM  DESCRIPTION OF PROCEDURE:  Under adequate spinal anesthesia, the patient subsequently had eclamptic seizure, elevated blood pressure was 180s/110s.  Ultrasound and Doppler were unable to ascertain the heart rate.  An indwelling Foley catheter had been placed while anesthesia was stabilized with the patient with the finding of the inability to detect the heart rate.  Quick movement to do a crash cesarean section with Betadine being splashed on the abdomen.  The patient was quickly draped.  An incision  was made through the previous Pfannenstiel and carried down to the rectus fascia, opened transversely superiorly.  The rectus fascia was bluntly and sharply dissected off the rectus muscle.  Rectus muscle split in the midline.  The parietal peritoneum was entered bluntly.  Bladder retractor was then placed.  A transverse incision was then made and extended with bandage scissors.  Artificial rupture of membranes was done.  Clear copious amniotic fluid, floating vertex which actually baby was in the  transverse position with back down directed into the field; however, it was still floating. A vacuum assistance attempt x3.  The subsequent delivery of floppy female infant.  Cord was quickly clamped, cut.   The baby was transferred to the awaiting pediatricians.   Apgar were 5 and 9 at one and five minutes respectively .  Placenta was manually removed after cord pH and cord bloods were obtained.  Uterine cavity was cleaned of debris.  Uterine incision had no extension.  It was closed in 2 layers, the first layer 0 Monocryl running lock stitch, second layer was imbricated using 0 Monocryl suture.  The vesicouterine peritoneum was sharply dissected off that lower segment.  A figure-of-eight 0 Monocryl suture was placed on the left angle for bleeding.  Normal tubes and ovaries were noted bilaterally.  The gutters were cleaned of debris.  The abdomen was irrigated and suctioned.  Interceed was placed in inverted T fashion.  The parietal peritoneum was then identified and closed with 2-0 Vicryl.  The rectus fascia was closed with 0 Vicryl x2.  The subcutaneous area was irrigated, small bleeders cauterized.  Interrupted 2-0 plain sutures placed and the skin approximated with 4-0 Vicryl subcuticular  closure.  Due to the deep subcutaneous area, vacuum style dressing was placed.  Placenta was sent to pathology.  Cord pH was pending.  ESTIMATED BLOOD LOSS:  330 ml.  INTRAOPERATIVE FLUIDS:  1300 ml crystalloid, 250 ml albumin.  URINE OUTPUT:  100 mL clear yellow urine.  COUNTS:  Sponge and instrument count x2 was correct.  COMPLICATIONS:  None.  DISPOSITION:  The patient tolerated the procedure well after she had become postictal.  Intraoperatively, a magnesium bolus was initiated, and the patient had received prophylactic antibiotics, which will be continued due to the minimal prepping that had been done at the time of the surgery.  The patient was subsequently transferred to the recovery room in stable condition.  Baby remained in the operating room.  LN/NUANCE  D:03/15/2018 T:03/15/2018  JOB:[id number]

## 2018-03-16 ENCOUNTER — Encounter (HOSPITAL_COMMUNITY): Payer: Self-pay | Admitting: Obstetrics and Gynecology

## 2018-03-16 LAB — COMPREHENSIVE METABOLIC PANEL
ALT: 16 U/L (ref 0–44)
ANION GAP: 8 (ref 5–15)
AST: 21 U/L (ref 15–41)
Albumin: 2.9 g/dL — ABNORMAL LOW (ref 3.5–5.0)
Alkaline Phosphatase: 73 U/L (ref 38–126)
BUN: 19 mg/dL (ref 6–20)
CO2: 22 mmol/L (ref 22–32)
Calcium: 7.8 mg/dL — ABNORMAL LOW (ref 8.9–10.3)
Chloride: 103 mmol/L (ref 98–111)
Creatinine, Ser: 1.08 mg/dL — ABNORMAL HIGH (ref 0.44–1.00)
GFR calc Af Amer: >60 mL/min (ref >60)
GFR calc non Af Amer: >60 mL/min (ref >60)
Glucose, Bld: 133 mg/dL — ABNORMAL HIGH (ref 70–99)
Potassium: 4.1 mmol/L (ref 3.5–5.1)
Sodium: 133 mmol/L — ABNORMAL LOW (ref 135–145)
Total Bilirubin: 0.2 mg/dL — ABNORMAL LOW (ref 0.3–1.2)
Total Protein: 6 g/dL — ABNORMAL LOW (ref 6.5–8.1)

## 2018-03-16 LAB — CBC
HCT: 30.8 % — ABNORMAL LOW (ref 36.0–46.0)
Hemoglobin: 10.1 g/dL — ABNORMAL LOW (ref 12.0–15.0)
MCH: 27.4 pg (ref 26.0–34.0)
MCHC: 32.8 g/dL (ref 30.0–36.0)
MCV: 83.7 fL (ref 80.0–100.0)
PLATELETS: 229 10*3/uL (ref 150–400)
RBC: 3.68 MIL/uL — AB (ref 3.87–5.11)
RDW: 14.3 % (ref 11.5–15.5)
WBC: 9.8 10*3/uL (ref 4.0–10.5)
nRBC: 0 % (ref 0.0–0.2)

## 2018-03-16 MED ORDER — IBUPROFEN 100 MG/5ML PO SUSP
800.0000 mg | Freq: Three times a day (TID) | ORAL | Status: DC
  Administered 2018-03-16 (×2): 800 mg via ORAL
  Administered 2018-03-17: 20 mg via ORAL
  Administered 2018-03-17: 800 mg via ORAL
  Filled 2018-03-16 (×7): qty 40

## 2018-03-16 MED ORDER — COMPLETENATE 29-1 MG PO CHEW
1.0000 | CHEWABLE_TABLET | Freq: Every day | ORAL | Status: DC
  Administered 2018-03-16 – 2018-03-17 (×2): 1 via ORAL
  Filled 2018-03-16 (×3): qty 1

## 2018-03-16 NOTE — Anesthesia Postprocedure Evaluation (Signed)
Anesthesia Post Note  Patient: [place]  Procedure(s) Performed: Repeat CESAREAN SECTION (N/A )     Patient location during evaluation: Mother Baby Anesthesia Type: Spinal Level of consciousness: oriented and awake and alert Pain management: pain level controlled Vital Signs Assessment: post-procedure vital signs reviewed and stable Respiratory status: spontaneous breathing and respiratory function stable Cardiovascular status: blood pressure returned to baseline and stable Postop Assessment: no headache, no backache, no apparent nausea or vomiting and able to ambulate Anesthetic complications: no    Last Vitals:  Vitals:   03/16/18 0524 03/16/18 0734  BP: 137/78 137/84  Pulse: 72 74  Resp: 17 18  Temp: 36.6 C 36.9 C  SpO2: 98% 100%    Last Pain:  Vitals:   03/16/18 0918  TempSrc:   PainSc: 4                  Barnet Glasgow

## 2018-03-16 NOTE — Lactation Note (Signed)
This note was copied from a baby's chart. Lactation Consultation Note  Patient Name: Kylie Robertson OMBTD'H Date: 03/16/2018 Reason for consult: Initial assessment;Early term 37-38.6wks;Infant weight loss;Other (Comment)(4% weight loss )  Baby is 24 hours old  Baby awake and hungry. Mom latched the baby had the baby was sluggish , so the LC offered to assist  And place baby STS to enhance the baby staying awake for the feeding.  LC reviewed hand expressing prior to latch / several small drops noted.  Pine worked with mom on increase the depth with latch. Baby latched / opened mouth wide and fed for 22 mins  With multiple swallows. Nipple well rounded when  mom released baby due to being non - nutritive, per mom 'felt  a good tug and it was comfortable. Baby asleep after feeding STS.  Voids and stools QS for age.  Due weight loss only being 3 %, and over 6 pounds, for today can hold off on pumping, also baby is latching well.  Per mom has 2 DEBP at home [place]. Plans to see Ir[facility] with [facility] [address] next week.  Mother informed of post-discharge support and given phone number to the lactation department, including services for phone call assistance; out-patient appointments; and breastfeeding support group. List of other breastfeeding resources in the community given in the handout. Encouraged mother to call for problems or concerns related to breastfeeding.  Maternal Data Has patient been taught Hand Expression?: Yes Does the patient have breastfeeding experience prior to this delivery?: Yes  Feeding Feeding Type: Breast Fed  LATCH Score Latch: Grasps breast easily, tongue down, lips flanged, rhythmical sucking.  Audible Swallowing: Spontaneous and intermittent  Type of Nipple: Everted at rest and after stimulation  Comfort (Breast/Nipple): Soft / non-tender  Hold (Positioning): Assistance needed to correctly position infant at breast and  maintain latch.  LATCH Score: 9  Interventions Interventions: Breast feeding basics reviewed;Assisted with latch;Skin to skin;Breast massage;Hand express;Breast compression;Adjust position;Support pillows;Position options  Lactation Tools Discussed/Used     Consult Status Consult Status: Follow-up Date: 03/17/18 Follow-up type: In-patient    [place] 03/16/2018, 2:25 PM

## 2018-03-16 NOTE — Progress Notes (Signed)
POSTOPERATIVE DAY # 1 S/P Repeat LTCS, Chronic hypertension with superimposed preeclampsia, Eclamptic seizure, baby boy    S:         Reports feeling well, minimal discomfort   Denies HA, visual changes, RUQ/epigastric pain              Tolerating po intake / no nausea / no vomiting / + flatus / no BM  Denies dizziness, SOB, or CP             Bleeding is light             Pain controlled - has not required pain medication              Up with assistance/ foley catheter in place   Newborn breast feeding - going well per mom; states baby has been sleepy, but latching well / Circumcision - planning tomorrow    O:  VS: BP 137/84 (BP Location: Right Arm)   Pulse 74   Temp 98.4 F (36.9 C) (Oral)   Resp 18   Ht 5\' 7"  (1.702 m)   Wt (!) 140.3 kg   LMP 06/29/2017   SpO2 100%   Breastfeeding Yes   BMI 48.46 kg/m    03/16/18 07:34:04  98.4 F (36.9 C)  74  -  18  137/84  Lying  100 %  Room Air  - CR   03/16/18 05:24:42  97.8 F (36.6 C)  72  -  17  137/78  Sitting  98 %  Room Air  - AW   03/16/18 0300  97.9 F (36.6 C)  78  -  18  140/90  Semi-fowlers  100 %  Room Air  - [facility]   03/16/18 02:02:51  -  70  -  18  125/78  -  -  -  - HK   03/16/18 01:00:27  -  69  -  18  130/83  Lying  -  -  - HK   03/16/18 00:10:31  -  78  -  18  132/76  Standing  -  -  - HK   03/16/18 00:06:55  97.8 F (36.6 C)  70  -  18  126/87  Sitting  -  -  - HK   03/16/18 00:02:04  -  71  -  18  136/82  Lying  -  -  - HK   03/15/18 23:05:02  -  79  -  -  131/78  -  -  -  - HK   03/15/18 22:09:01  -  81  -  -  124/76  -  -  -  - HK   03/15/18 2048  -  -  -  18  -  -  -  -  - HK   03/15/18 2048  -  80  -  -  135/85  Semi-fowlers  93 %  -  - AW   03/15/18 20:01:05  -  63  -  -  138/92Abnormal   -  100 %  -  - HK   03/15/18 1935  98 F (36.7 C)  62  -  18  132/60  Semi-fowlers  100 %  Room Air  - HK   03/15/18 1858  98 F (36.7 C)  61  -  20  137/87  Semi-fowlers  98 %  -  - LS   03/15/18 1757  97.8 F (36.6 C)  63   -  20  124/78  Semi-fowlers  99 %  -  - LS   03/15/18 16:53:28  97.3 F (36.3 C)Abnormal   59Abnormal   -  20  139/85  -  100 %  -  - JB   03/15/18 1630  -  59Abnormal   62  18  127/81  Lying  97 %  Room Air  - AG   03/15/18 1625  -  63  63  18  127/74  Lying  99 %  Room Air  - AG   03/15/18 1620  -  63  63  20  125/82  Lying  97 %  Room Air  - AG      LABS:               Results for orders placed or performed during the hospital encounter of 03/15/18 (from the past 24 hour(s))  Uric acid     Status: Abnormal   Collection Time: 03/15/18 12:13 PM  Result Value Ref Range   Uric Acid, Serum 9.9 (H) 2.5 - 7.1 mg/dL  Comprehensive metabolic panel     Status: Abnormal   Collection Time: 03/15/18 12:13 PM  Result Value Ref Range   Sodium 136 135 - 145 mmol/L   Potassium 4.6 3.5 - 5.1 mmol/L   Chloride 106 98 - 111 mmol/L   CO2 22 22 - 32 mmol/L   Glucose, Bld 88 70 - 99 mg/dL   BUN 24 (H) 6 - 20 mg/dL   Creatinine, Ser 1.04 (H) 0.44 - 1.00 mg/dL   Calcium 9.5 8.9 - 10.3 mg/dL   Total Protein 6.9 6.5 - 8.1 g/dL   Albumin 3.3 (L) 3.5 - 5.0 g/dL   AST 20 15 - 41 U/L   ALT 22 0 - 44 U/L   Alkaline Phosphatase 90 38 - 126 U/L   Total Bilirubin 0.5 0.3 - 1.2 mg/dL   GFR calc non Af Amer >60 >60 mL/min   GFR calc Af Amer >60 >60 mL/min   Anion gap 8 5 - 15  Magnesium     Status: Abnormal   Collection Time: 03/15/18  6:05 PM  Result Value Ref Range   Magnesium 3.8 (H) 1.7 - 2.4 mg/dL  CBC     Status: Abnormal   Collection Time: 03/16/18  5:32 AM  Result Value Ref Range   WBC 9.8 4.0 - 10.5 K/uL   RBC 3.68 (L) 3.87 - 5.11 MIL/uL   Hemoglobin 10.1 (L) 12.0 - 15.0 g/dL   HCT 30.8 (L) 36.0 - 46.0 %   MCV 83.7 80.0 - 100.0 fL   MCH 27.4 26.0 - 34.0 pg   MCHC 32.8 30.0 - 36.0 g/dL   RDW 14.3 11.5 - 15.5 %   Platelets 229 150 - 400 K/uL   nRBC 0.0 0.0 - 0.2 %  Comprehensive metabolic panel     Status: Abnormal   Collection Time: 03/16/18  5:32 AM  Result Value Ref Range   Sodium 133  (L) 135 - 145 mmol/L   Potassium 4.1 3.5 - 5.1 mmol/L   Chloride 103 98 - 111 mmol/L   CO2 22 22 - 32 mmol/L   Glucose, Bld 133 (H) 70 - 99 mg/dL   BUN 19 6 - 20 mg/dL   Creatinine, Ser 1.08 (H) 0.44 - 1.00 mg/dL   Calcium 7.8 (L) 8.9 - 10.3 mg/dL   Total Protein 6.0 (L) 6.5 - 8.1 g/dL   Albumin 2.9 (L) 3.5 -  5.0 g/dL   AST 21 15 - 41 U/L   ALT 16 0 - 44 U/L   Alkaline Phosphatase 73 38 - 126 U/L   Total Bilirubin 0.2 (L) 0.3 - 1.2 mg/dL   GFR calc non Af Amer >60 >60 mL/min   GFR calc Af Amer >60 >60 mL/min   Anion gap 8 5 - 15               Bloodtype: --/--/B POS (01/27 1210)  Rubella: Immune (07/17 0000)                                             I&O: Intake/Output      01/28 0701 - 01/29 0700 01/29 0701 - 01/30 0700   P.O. 1490 490   I.V. (mL/kg) 2552.9 (18.2) 810 (5.8)   IV Piggyback 500    Total Intake(mL/kg) 4542.9 (32.4) 1300 (9.3)   Urine (mL/kg/hr) 2575 1150 (2.4)   Blood 300    Total Output 2875 1150   Net +1667.9 +150                     Physical Exam:             Alert and Oriented X3  Lungs: Clear and unlabored  Heart: regular rate and rhythm / no murmurs  Abdomen: soft, non-tender, non-distended, active bowel sounds in all quadrants              Fundus: firm, non-tender, U-E             Dressing: Prevena wound vac in place, c/d/i              Incision:  approximated with sutures / unable to visualize due to wound vac  Perineum: intact  Lochia: small, no clots   Extremities: non-pitting ankle and pedal edema, no calf pain or tenderness, TED hose removed by patient, +1 DTRs bilaterally, no clonus bilaterally   A/P:     POD # 1 S/P Repeat LTCS            CHTN with superimposed preeclampsia and s/p eclamptic seizure   - Currently on Magnesium 2 grams/hr; s/p 6 gram bolus yesterday - d/c Magnesium sulfate at 1400    - BPs stable, no neural s/s    - Serum creatinine elevated   - Continue Strict I&Os   - Daily weights    - Keep indwelling foley catheter  until d/c Magnesium sulfate, then may remove   - Repeat CBC, CMP tomorrow   ABL Anemia    - Niferex 150mg  PO daily  Osteoarthritis   Routine postoperative care              See lactation today   May shower this afternoon   Continue current care    Consult for plan: Dr. Ericka Pontiff, MSN, CNM [facility]

## 2018-03-16 NOTE — Anesthesia Postprocedure Evaluation (Signed)
Anesthesia Post Note  [facility]  Procedure(s) Performed: Repeat CESAREAN SECTION (N/A )     Patient location during evaluation: [facility] Unit Anesthesia Type: Spinal Level of consciousness: awake, awake and alert and oriented Pain management: pain level controlled Vital Signs Assessment: post-procedure vital signs reviewed and stable Respiratory status: spontaneous breathing and respiratory function stable Cardiovascular status: blood pressure returned to baseline Postop Assessment: no headache, no backache, spinal receding, patient able to bend at knees, no apparent nausea or vomiting, adequate PO intake and able to ambulate Anesthetic complications: no    Last Vitals:  Vitals:   03/16/18 0524 03/16/18 0734  BP: 137/78 137/84  Pulse: 72 74  Resp: 17 18  Temp: 36.6 C 36.9 C  SpO2: 98% 100%    Last Pain:  Vitals:   03/16/18 0918  TempSrc:   PainSc: 4    Pain Goal:                   Bufford Spikes

## 2018-03-17 LAB — COMPREHENSIVE METABOLIC PANEL
ALT: 15 U/L (ref 0–44)
AST: 17 U/L (ref 15–41)
Albumin: 2.8 g/dL — ABNORMAL LOW (ref 3.5–5.0)
Alkaline Phosphatase: 66 U/L (ref 38–126)
Anion gap: 7 (ref 5–15)
BUN: 15 mg/dL (ref 6–20)
CO2: 23 mmol/L (ref 22–32)
Calcium: 7.4 mg/dL — ABNORMAL LOW (ref 8.9–10.3)
Chloride: 105 mmol/L (ref 98–111)
Creatinine, Ser: 0.98 mg/dL (ref 0.44–1.00)
GFR calc Af Amer: >60 mL/min (ref >60)
GFR calc non Af Amer: >60 mL/min (ref >60)
Glucose, Bld: 118 mg/dL — ABNORMAL HIGH (ref 70–99)
Potassium: 4.1 mmol/L (ref 3.5–5.1)
Sodium: 135 mmol/L (ref 135–145)
Total Bilirubin: 0.4 mg/dL (ref 0.3–1.2)
Total Protein: 6.3 g/dL — ABNORMAL LOW (ref 6.5–8.1)

## 2018-03-17 LAB — CBC
HCT: 32 % — ABNORMAL LOW (ref 36.0–46.0)
Hemoglobin: 10.3 g/dL — ABNORMAL LOW (ref 12.0–15.0)
MCH: 27.4 pg (ref 26.0–34.0)
MCHC: 32.2 g/dL (ref 30.0–36.0)
MCV: 85.1 fL (ref 80.0–100.0)
Platelets: 245 10*3/uL (ref 150–400)
RBC: 3.76 MIL/uL — ABNORMAL LOW (ref 3.87–5.11)
RDW: 14.7 % (ref 11.5–15.5)
WBC: 8.4 10*3/uL (ref 4.0–10.5)
nRBC: 0 % (ref 0.0–0.2)

## 2018-03-17 MED ORDER — IBUPROFEN 100 MG/5ML PO SUSP: 800.0000 mg | Freq: Three times a day (TID) | ORAL | 4 refills | Status: DC

## 2018-03-17 MED ORDER — OXYCODONE HCL 5 MG PO TABS: 5.0000 mg | ORAL_TABLET | ORAL | 0 refills | Status: AC | PRN

## 2018-03-17 NOTE — Progress Notes (Signed)
POD#2 status post repeat cesarean section with eclamptic seizure.  S: Pt notes pain controlled w/ po meds, minimal lochia, out of bed w/o dizziness or chest pain, tol reg po, + flatus.  Foley just removed.  Patient notes no headache, no right upper quadrant pain, no vision change.  On labetalol 400 mg p.o. twice daily and Procardia 60 mg XL once daily  Vitals:   03/16/18 1638 03/16/18 1927 03/16/18 2331 03/17/18 0503  BP: 121/77 111/67 (!) 116/50 (!) 145/84  Pulse: 64 67 67 67  Resp: 16 16 16 18   Temp: 98.3 F (36.8 C) 98.3 F (36.8 C) 98.1 F (36.7 C) 98.6 F (37 C)  TempSrc: Oral Oral Oral   SpO2: 95% 99% 94%   Weight:      Height:        Gen: well appearing.  Morbidly obese CV: RRR Pulm: CTAB Abd: soft, ND, approp tender, fundus below umbilicus, NT Inc: C/D/I, dressed LE: tr edema, NT  CBC    Component Value Date/Time   WBC 8.4 03/17/2018 0514   RBC 3.76 (L) 03/17/2018 0514   HGB 10.3 (L) 03/17/2018 0514   HCT 32.0 (L) 03/17/2018 0514   PLT 245 03/17/2018 0514   MCV 85.1 03/17/2018 0514   MCH 27.4 03/17/2018 0514   MCHC 32.2 03/17/2018 0514   RDW 14.7 03/17/2018 0514   LYMPHSABS 1.5 03/14/2018 1210   MONOABS 0.4 03/14/2018 1210   EOSABS 0.1 03/14/2018 1210   BASOSABS 0.0 03/14/2018 1210   CMP Latest Ref Rng & Units 03/17/2018 03/16/2018 03/15/2018  Glucose 70 - 99 mg/dL 118(H) 133(H) 88  BUN 6 - 20 mg/dL 15 19 24(H)  Creatinine 0.44 - 1.00 mg/dL 0.98 1.08(H) 1.04(H)  Sodium 135 - 145 mmol/L 135 133(L) 136  Potassium 3.5 - 5.1 mmol/L 4.1 4.1 4.6  Chloride 98 - 111 mmol/L 105 103 106  CO2 22 - 32 mmol/L 23 22 22   Calcium 8.9 - 10.3 mg/dL 7.4(L) 7.8(L) 9.5  Total Protein 6.5 - 8.1 g/dL 6.3(L) 6.0(L) 6.9  Total Bilirubin 0.3 - 1.2 mg/dL 0.4 0.2(L) 0.5  Alkaline Phos 38 - 126 U/L 66 73 90  AST 15 - 41 U/L 17 21 20   ALT 0 - 44 U/L 15 16 22      A/P: POD#2 s/p repeat cesarean section with superimposed eclampsia - post-op. Doing well.  -Eclampsia, hypertension.   No symptoms, labs improving.  Blood pressure have been normal since delivery though a.m. blood pressure today higher than others.  We will keep close eye on blood pressure and if elevating will consider moving labetalol to 3 times daily and increasing her dose.  Ala Dach 03/17/2018 8:24 AM

## 2018-03-17 NOTE — Discharge Summary (Signed)
OB Discharge Summary     Patient Name: Kylie Robertson DOB: 1975-10-05 MRN: [id number]  Date of admission: 03/15/2018 Delivering MD: Lani Mendiola   Date of discharge: 03/17/2018  Admitting diagnosis: Previous Cesarean Section, Chronic Hypertension, Superimposed preeclampsia Intrauterine pregnancy: [redacted]w[redacted]d     Secondary diagnosis:  Principal Problem:   Postpartum care following cesarean delivery (1/28) Active Problems:   Chronic hypertension with superimposed preeclampsia   Previous cesarean section   Eclampsia complicating pregnancy in third trimester  Additional problems: osteoarthritis     Discharge diagnosis: chronic HTN with eclampsia, osteoarthritis, previous Cesarean section, IUP @ 37 weeks                                                                                                Post partum procedures:magnesium sulfate  Augmentation: none  Complications: None  Hospital course:  Sceduled C/S   43 y.o. yo [id number] at [redacted]w[redacted]d was admitted to the hospital 03/15/2018 for scheduled cesarean section with the following indication:Elective Repeat and chronic HTN with superimposed preeclampsia  Membrane Rupture Time/Date: 1:50 PM ,03/15/2018   Patient delivered a Viable infant.03/15/2018  Details of operation can be found in separate operative note.  Pateint had postpartum course notable for magnesium sulfate for 24 hrs with great duireses..  She is ambulating, tolerating a regular diet, passing flatus, and urinating well. Patient is discharged home in stable condition on  03/17/18         Physical exam  Vitals:   03/16/18 2331 03/17/18 0503 03/17/18 0825 03/17/18 1159  BP: (!) 116/50 (!) 145/84 133/70 131/70  Pulse: 67 67 (!) 59 70  Resp: 16 18 18 18   Temp: 98.1 F (36.7 C) 98.6 F (37 C) 98.4 F (36.9 C) 98.7 F (37.1 C)  TempSrc: Oral  Oral Oral  SpO2: 94%  99% 99%  Weight:      Height:       General: alert, cooperative and no distress Lochia:  appropriate Uterine Fundus: firm Incision: Dressing is clean, dry, and intact DVT Evaluation: No cords or calf tenderness. No significant calf/ankle edema. Labs: Lab Results  Component Value Date   WBC 8.4 03/17/2018   HGB 10.3 (L) 03/17/2018   HCT 32.0 (L) 03/17/2018   MCV 85.1 03/17/2018   PLT 245 03/17/2018   CMP Latest Ref Rng & Units 03/17/2018  Glucose 70 - 99 mg/dL 118(H)  BUN 6 - 20 mg/dL 15  Creatinine 0.44 - 1.00 mg/dL 0.98  Sodium 135 - 145 mmol/L 135  Potassium 3.5 - 5.1 mmol/L 4.1  Chloride 98 - 111 mmol/L 105  CO2 22 - 32 mmol/L 23  Calcium 8.9 - 10.3 mg/dL 7.4(L)  Total Protein 6.5 - 8.1 g/dL 6.3(L)  Total Bilirubin 0.3 - 1.2 mg/dL 0.4  Alkaline Phos 38 - 126 U/L 66  AST 15 - 41 U/L 17  ALT 0 - 44 U/L 15    Discharge instruction: per After Visit Summary and "Baby and Me Booklet".  After visit meds:  Allergies as of 03/17/2018   No Known Allergies     Medication List  TAKE these medications   calcium carbonate 500 MG chewable tablet Commonly known as:  TUMS - dosed in mg elemental calcium Chew 1-2 tablets by mouth 3 (three) times daily as needed for indigestion or heartburn.   docusate sodium 100 MG capsule Commonly known as:  COLACE Take 100 mg by mouth at bedtime.   ferrous sulfate 325 (65 FE) MG tablet Take 1 tablet (325 mg total) by mouth daily with breakfast. What changed:  when to take this   ibuprofen 100 MG/5ML suspension Commonly known as:  ADVIL,MOTRIN Take 40 mLs (800 mg total) by mouth every 8 (eight) hours.   labetalol 200 MG tablet Commonly known as:  NORMODYNE Take 2 tablets (400 mg total) by mouth 2 (two) times daily.   NIFEdipine 60 MG 24 hr tablet Commonly known as:  ADALAT CC Take 60 mg by mouth daily.   oxyCODONE 5 MG immediate release tablet Commonly known as:  Oxy IR/ROXICODONE Take 1-2 tablets (5-10 mg total) by mouth every 4 (four) hours as needed for up to 7 days for moderate pain.   Prenatal Adult Gummy/DHA/FA  0.4-25 MG Chew Chew 2 tablets by mouth every evening.       Diet: low salt diet  Activity: Advance as tolerated. Pelvic rest for 6 weeks.   Outpatient follow up:6 weeks Follow up Appt:No future appointments. Follow up Visit:No follow-ups on file.  Postpartum contraception: Vasectomy  Newborn Data: Live born female  Birth Weight: 6 lb 15.1 oz (3150 g) APGAR: 5, 9  Newborn Delivery   Birth date/time:  03/15/2018 13:52:00 Delivery type:  C-Section, Vacuum Assisted Trial of labor:  No C-section categorization:  Repeat     Baby Feeding: Bottle and Breast Disposition:home with mother   03/17/2018 Marvene Staff, MD

## 2018-03-17 NOTE — Progress Notes (Signed)
Discharge instructions reviewed with patient.  Rn reviewed signs and symptoms of hypertension, pre-eclampsia and eclampsia. Patient verbalized understanding. Rn also reviewed medication changes and follow up appointments.

## 2018-03-17 NOTE — Lactation Note (Signed)
This note was copied from a baby's chart. Lactation Consultation Note  Patient Name: Kylie Robertson Today's Date: 03/17/2018   Visited with P2 Mom of ET infant at 92 hrs old.   Baby at 5% weight loss Mom choosing to breastfeed and supplement afterwards.  Mom is pumping also.   Talked about follow-up with OP appointment.  Has an appt with LC at [facility] office already. No questions currently Engorgement prevention and treatment discussed. Mom aware of OP lactation support available and encouraged to call prn.    Broadus John 03/17/2018, 4:13 PM

## 2018-03-18 ENCOUNTER — Inpatient Hospital Stay (HOSPITAL_COMMUNITY)
Admission: AD | Admit: 2018-03-18 | Discharge: 2018-03-18 | Disposition: A | Discharge status: Home / Self Care | Payer: Federal, State, Local not specified - PPO | Attending: Obstetrics and Gynecology | Admitting: Obstetrics and Gynecology

## 2018-03-18 ENCOUNTER — Encounter (HOSPITAL_COMMUNITY): Payer: Self-pay

## 2018-03-18 DIAGNOSIS — H538 Other visual disturbances: Secondary | ICD-10-CM | POA: Diagnosis not present

## 2018-03-18 DIAGNOSIS — O1003 Pre-existing essential hypertension complicating the puerperium: Secondary | ICD-10-CM

## 2018-03-18 LAB — COMPREHENSIVE METABOLIC PANEL
ALT: 20 U/L (ref 0–44)
AST: 22 U/L (ref 15–41)
Albumin: 3.2 g/dL — ABNORMAL LOW (ref 3.5–5.0)
Alkaline Phosphatase: 65 U/L (ref 38–126)
Anion gap: 7 (ref 5–15)
BUN: 12 mg/dL (ref 6–20)
CHLORIDE: 103 mmol/L (ref 98–111)
CO2: 22 mmol/L (ref 22–32)
Calcium: 8.4 mg/dL — ABNORMAL LOW (ref 8.9–10.3)
Creatinine, Ser: 0.81 mg/dL (ref 0.44–1.00)
GFR calc Af Amer: >60 mL/min (ref >60)
GFR calc non Af Amer: >60 mL/min (ref >60)
Glucose, Bld: 94 mg/dL (ref 70–99)
Potassium: 4.5 mmol/L (ref 3.5–5.1)
SODIUM: 132 mmol/L — AB (ref 135–145)
Total Bilirubin: 0.2 mg/dL — ABNORMAL LOW (ref 0.3–1.2)
Total Protein: 7 g/dL (ref 6.5–8.1)

## 2018-03-18 LAB — CBC
HCT: 33.4 % — ABNORMAL LOW (ref 36.0–46.0)
Hemoglobin: 10.8 g/dL — ABNORMAL LOW (ref 12.0–15.0)
MCH: 27.6 pg (ref 26.0–34.0)
MCHC: 32.3 g/dL (ref 30.0–36.0)
MCV: 85.2 fL (ref 80.0–100.0)
PLATELETS: 282 10*3/uL (ref 150–400)
RBC: 3.92 MIL/uL (ref 3.87–5.11)
RDW: 14.6 % (ref 11.5–15.5)
WBC: 9.2 10*3/uL (ref 4.0–10.5)
nRBC: 0 % (ref 0.0–0.2)

## 2018-03-18 LAB — URIC ACID: Uric Acid, Serum: 7.9 mg/dL — ABNORMAL HIGH (ref 2.5–7.1)

## 2018-03-18 NOTE — MAU Provider Note (Signed)
History     Chief Complaint  Patient presents with  . Blurred Vision   43 yo G3P2 BF with known chronic HTn w/ antepartum complication of eclampsia who was discharged yesterday presents with complain of " blurry vision". Pt states she see everyone fine but when she is looking at her phone and TV is when here vision was blurred. Denies h/a. Feels fine otherwise. Took BP med( procardia, labetalol).  Had eye exam a couple months ago and was fine. No epigastric pain . Leg swelling decreased  OB History    Gravida  3   Para  2   Term  2   Preterm      AB  1   Living  2     SAB      TAB  1   Ectopic      Multiple  0   Live Births  2           Past Medical History:  Diagnosis Date  . ASCUS (atypical squamous cells of undetermined significance) on Pap smear   . Chronic hypertension with superimposed preeclampsia 03/15/2018  . Eclampsia complicating pregnancy in third trimester 03/15/2018  . Endocervical polyp   . HSV-2 (herpes simplex virus 2) infection   . Hx of pre-eclampsia in prior pregnancy, currently pregnant   . Hypertension   . Osteoarthritis   . Pilonidal cyst   . Vaginal Pap smear, abnormal     Past Surgical History:  Procedure Laterality Date  . CESAREAN SECTION N/A 02/12/2017   Procedure: Primary CESAREAN SECTION;  Surgeon: Servando Salina, MD;  Location: [place];  Service: Obstetrics;  Laterality: N/A;  EDD: 02/19/17  . CESAREAN SECTION N/A 03/15/2018   Procedure: Repeat CESAREAN SECTION;  Surgeon: Servando Salina, MD;  Location: [place];  Service: Obstetrics;  Laterality: N/A;  EDD: 04/05/18  . HIP SURGERY    . WISDOM TOOTH EXTRACTION      Family History  Problem Relation Age of Onset  . Hypertension Paternal Grandfather   . Diabetes Paternal Grandfather   . Hypertension Paternal Grandmother   . Diabetes Paternal Grandmother   . Hypertension Maternal Grandmother   . Diabetes Maternal Grandmother   . Kidney disease  Maternal Grandmother   . Hypertension Maternal Grandfather   . Diabetes Maternal Grandfather   . Breast cancer Mother   . Heart attack Father   . Drug abuse Father     Social History   Tobacco Use  . Smoking status: Never Smoker  . Smokeless tobacco: Never Used  Substance Use Topics  . Alcohol use: Yes  . Drug use: No    Allergies: No Known Allergies  Medications Prior to Admission  Medication Sig Dispense Refill Last Dose  . docusate sodium (COLACE) 100 MG capsule Take 100 mg by mouth at bedtime.   03/17/2018 at Unknown time  . ferrous sulfate 325 (65 FE) MG tablet Take 1 tablet (325 mg total) by mouth daily with breakfast. (Patient taking differently: Take 325 mg by mouth every evening. ) 30 tablet 3 03/17/2018 at Unknown time  . ibuprofen (ADVIL,MOTRIN) 100 MG/5ML suspension Take 40 mLs (800 mg total) by mouth every 8 (eight) hours. 473 mL 4 03/17/2018 at Unknown time  . labetalol (NORMODYNE) 200 MG tablet Take 2 tablets (400 mg total) by mouth 2 (two) times daily. 120 tablet 0 03/17/2018 at Unknown time  . NIFEdipine (ADALAT CC) 60 MG 24 hr tablet Take 60 mg by mouth daily.  03/17/2018 at Unknown time  . Prenatal MV & Min w/FA-DHA (PRENATAL ADULT GUMMY/DHA/FA) 0.4-25 MG CHEW Chew 2 tablets by mouth every evening.    03/17/2018 at Unknown time  . calcium carbonate (TUMS - DOSED IN MG ELEMENTAL CALCIUM) 500 MG chewable tablet Chew 1-2 tablets by mouth 3 (three) times daily as needed for indigestion or heartburn.   03/14/2018 at Unknown time  . oxyCODONE (OXY IR/ROXICODONE) 5 MG immediate release tablet Take 1-2 tablets (5-10 mg total) by mouth every 4 (four) hours as needed for up to 7 days for moderate pain. 30 tablet 0      Physical Exam   Blood pressure (!) 148/80, pulse 66, temperature 98.6 F (37 C), temperature source Oral, resp. rate 20, height 5\' 7"  (1.702 m), weight 135.7 kg, last menstrual period 06/29/2017, currently breastfeeding.  General appearance: alert, cooperative  and no distress Eyes: conjunctivae/corneas clear. PERRL, EOM's intact. Fundi benign. Lungs: clear to auscultation bilaterally Heart: regular rate and rhythm, S1, S2 normal, no murmur, click, rub or gallop Abdomen: soft obese. primary dressing d/c/i Extremities: edema tr ED Course  IMP: blurry vision Chronic HTN s/p eclampsia postpartum P) PIH labs.   MDM   Marvene Staff, MD 2:18 AM 03/18/2018   Addendum:  CBC Latest Ref Rng & Units 03/18/2018 03/17/2018 03/16/2018  WBC 4.0 - 10.5 K/uL 9.2 8.4 9.8  Hemoglobin 12.0 - 15.0 g/dL 10.8(L) 10.3(L) 10.1(L)  Hematocrit 36.0 - 46.0 % 33.4(L) 32.0(L) 30.8(L)  Platelets 150 - 400 K/uL 282 245 229   CMP Latest Ref Rng & Units 03/18/2018 03/17/2018 03/16/2018  Glucose 70 - 99 mg/dL 94 118(H) 133(H)  BUN 6 - 20 mg/dL 12 15 19   Creatinine 0.44 - 1.00 mg/dL 0.81 0.98 1.08(H)  Sodium 135 - 145 mmol/L 132(L) 135 133(L)  Potassium 3.5 - 5.1 mmol/L 4.5 4.1 4.1  Chloride 98 - 111 mmol/L 103 105 103  CO2 22 - 32 mmol/L 22 23 22   Calcium 8.9 - 10.3 mg/dL 8.4(L) 7.4(L) 7.8(L)  Total Protein 6.5 - 8.1 g/dL 7.0 6.3(L) 6.0(L)  Total Bilirubin 0.3 - 1.2 mg/dL 0.2(L) 0.4 0.2(L)  Alkaline Phos 38 - 126 U/L 65 66 73  AST 15 - 41 U/L 22 17 21   ALT 0 - 44 U/L 20 15 16   advised pt to call eye doctor for updated exam D/c home. Cont BP meds. Keep f/u appt at office

## 2018-03-18 NOTE — Discharge Instructions (Signed)
As previously given on discharge from postpartum

## 2018-03-18 NOTE — MAU Note (Signed)
PT SAYS HAD C/S ON 1-28-  FOR REPEAT.   TUBING INTACT.   HAS CHRONIC HYPERTENSION- ON Tuesday- HAD SEIZURE  IN OR-   WAS ON MAG Tuesday / WED.     WAS D/C TODAY - ON MEDS- TOOK TODAY -  SO TONIGHT   AT 0030- VISION BECAME FUZZY , NO  H/A, NO EPIGASTRIC PAIN.     BREAST / BOTTLE FEEDING.

## 2018-04-26 DIAGNOSIS — Z130 Encounter for screening for diseases of the blood and blood-forming organs and certain disorders involving the immune mechanism: Secondary | ICD-10-CM | POA: Diagnosis not present

## 2018-04-27 ENCOUNTER — Other Ambulatory Visit (HOSPITAL_COMMUNITY): Payer: Self-pay

## 2018-04-27 ENCOUNTER — Other Ambulatory Visit: Payer: Self-pay | Admitting: Obstetrics and Gynecology

## 2018-04-29 ENCOUNTER — Other Ambulatory Visit: Payer: Self-pay | Admitting: Obstetrics and Gynecology

## 2018-07-18 ENCOUNTER — Encounter: Payer: Self-pay | Admitting: Family Medicine

## 2018-07-18 ENCOUNTER — Other Ambulatory Visit: Payer: Self-pay

## 2018-07-18 ENCOUNTER — Ambulatory Visit (INDEPENDENT_AMBULATORY_CARE_PROVIDER_SITE_OTHER): Payer: Federal, State, Local not specified - PPO | Admitting: Family Medicine

## 2018-07-18 DIAGNOSIS — J302 Other seasonal allergic rhinitis: Secondary | ICD-10-CM

## 2018-07-18 DIAGNOSIS — I10 Essential (primary) hypertension: Secondary | ICD-10-CM

## 2018-07-18 DIAGNOSIS — Z7689 Persons encountering health services in other specified circumstances: Secondary | ICD-10-CM

## 2018-07-18 DIAGNOSIS — Z8739 Personal history of other diseases of the musculoskeletal system and connective tissue: Secondary | ICD-10-CM | POA: Insufficient documentation

## 2018-07-18 MED ORDER — LABETALOL HCL 200 MG PO TABS: 400.0000 mg | ORAL_TABLET | Freq: Two times a day (BID) | ORAL | 2 refills | Status: DC

## 2018-07-18 MED ORDER — NIFEDIPINE ER 60 MG PO TB24: 60.0000 mg | ORAL_TABLET | Freq: Every day | ORAL | 2 refills | Status: DC

## 2018-07-18 NOTE — Progress Notes (Signed)
Virtual Visit via Video Note  I connected with Kylie Robertson on 07/18/18 at 11:30 AM EDT by a video enabled telemedicine application and verified that I am speaking with the correct person using two identifiers.  Location patient: home Location provider:work or home office Persons participating in the virtual visit: patient, provider  I discussed the limitations of evaluation and management by telemedicine and the availability of in person appointments. The patient expressed understanding and agreed to proceed.   HPI: Pt is a 43 yo female with pmh sig for HTN, h/o eclampsia seen for establish care appt and f/u on chronic conditions.  Pt previously seen by Dr. Lavone Neri at [facility].  Also seen by OB/Gyn, Dr. Garwin Brothers.  HTN: not checking bp consistently.  Pt with cHTN with superimposed PreE and eclampsia during her 2 pregnancies.  BP typically 130s/70s.  Eating fruits, vegetables, seafood.  Cooking at home.  Not eating a lot of fried foods or red meat.  May drink 32-64 oz of water per day.  Not exercising consistently.  Was going to water aerobics prior to COVID-19 pandemic.  Denies CP, HAs, blurred vision, dizziness.  Taking labetalol 400 mg BID and procardia 60 mg daily.  Was on lisinopril-HCTZ prior to pregnancy, but developed dry cough.  H/o Eclampsia: had a seizure after receiving epidural during most recent pregnancy.   Seasonal allergies:  May take OTC med prn.  Typically has eye irritation.  States it looks like she has "pink eye".  Will go to the ophthalmologist, typically requires pataday eye gtts.  Allergies:  Lisinopril -dry cough  Past Surgical Hx: SCFE pins placed and removed C-section x 2  Family hx: Mom-breast ca, DM Both sets of grandparents HTN Maternal grandparents DM MGM-kidney dx  Has mammogram schedule  LMP-currently on.   ROS: See pertinent positives and negatives per HPI.  Past Medical History:  Diagnosis Date  . ASCUS (atypical squamous cells of  undetermined significance) on Pap smear   . Chronic hypertension with superimposed preeclampsia 03/15/2018  . Eclampsia complicating pregnancy in third trimester 03/15/2018  . Endocervical polyp   . HSV-2 (herpes simplex virus 2) infection   . Hx of pre-eclampsia in prior pregnancy, currently pregnant   . Hypertension   . Osteoarthritis   . Pilonidal cyst   . Vaginal Pap smear, abnormal     Past Surgical History:  Procedure Laterality Date  . CESAREAN SECTION N/A 02/12/2017   Procedure: Primary CESAREAN SECTION;  Surgeon: Servando Salina, MD;  Location: [place];  Service: Obstetrics;  Laterality: N/A;  EDD: 02/19/17  . CESAREAN SECTION N/A 03/15/2018   Procedure: Repeat CESAREAN SECTION;  Surgeon: Servando Salina, MD;  Location: [facility] [place];  Service: Obstetrics;  Laterality: N/A;  EDD: 04/05/18  . HIP SURGERY    . WISDOM TOOTH EXTRACTION      Family History  Problem Relation Age of Onset  . Hypertension Paternal Grandfather   . Diabetes Paternal Grandfather   . Hypertension Paternal Grandmother   . Diabetes Paternal Grandmother   . Hypertension Maternal Grandmother   . Diabetes Maternal Grandmother   . Kidney disease Maternal Grandmother   . Hypertension Maternal Grandfather   . Diabetes Maternal Grandfather   . Breast cancer Mother   . Heart attack Father   . Drug abuse Father     SOCIAL HX: Pt is married.  Her husband (also seen by this provider) recently got a vasectomy.  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  She has two sons, a 80 mo and a 4 mo.  Denies tobacco or drug use. Recently started drinking again, may have one EtOH drink/ wk.  Recently stopped breastfeeding.   Current Outpatient Medications:  .  calcium carbonate (TUMS - DOSED IN MG ELEMENTAL CALCIUM) 500 MG chewable tablet, Chew 1-2 tablets by mouth 3 (three) times daily as needed for indigestion or heartburn., Disp: , Rfl:  .  docusate sodium (COLACE)  100 MG capsule, Take 100 mg by mouth at bedtime., Disp: , Rfl:  .  ferrous sulfate 325 (65 FE) MG tablet, Take 1 tablet (325 mg total) by mouth daily with breakfast. (Patient taking differently: Take 325 mg by mouth every evening. ), Disp: 30 tablet, Rfl: 3 .  ibuprofen (ADVIL,MOTRIN) 100 MG/5ML suspension, Take 40 mLs (800 mg total) by mouth every 8 (eight) hours., Disp: 473 mL, Rfl: 4 .  labetalol (NORMODYNE) 200 MG tablet, Take 2 tablets (400 mg total) by mouth 2 (two) times daily., Disp: 120 tablet, Rfl: 0 .  NIFEdipine (ADALAT CC) 60 MG 24 hr tablet, Take 60 mg by mouth daily., Disp: , Rfl:  .  Prenatal MV & Min w/FA-DHA (PRENATAL ADULT GUMMY/DHA/FA) 0.4-25 MG CHEW, Chew 2 tablets by mouth every evening. , Disp: , Rfl:   EXAM:  VITALS per patient if applicable:  RR between 12-20 bpm  GENERAL: alert, oriented, appears well and in no acute distress  HEENT: atraumatic, conjunctiva clear, no obvious abnormalities on inspection of external nose and ears  NECK: normal movements of the head and neck  LUNGS: on inspection no signs of respiratory distress, breathing rate appears normal, no obvious gross SOB, gasping or wheezing  CV: no obvious cyanosis  MS: moves all visible extremities without noticeable abnormality  PSYCH/NEURO: pleasant and cooperative, no obvious depression or anxiety, speech and thought processing grossly intact  ASSESSMENT AND PLAN:  Discussed the following assessment and plan:  Essential hypertension  -controlled -discussed lifestyle modifications -continue current meds -pt to check bp more consistently and keep a record to bring with her to clinic. - Plan: labetalol (NORMODYNE) 200 MG tablet, NIFEdipine (ADALAT CC) 60 MG 24 hr tablet  Seasonal allergies -continue OTC med and pataday prn  Encounter to establish care -We reviewed the PMH, PSH, FH, SH, Meds and Allergies. -We provided refills for any medications we will prescribe as needed. -We addressed  current concerns per orders and patient instructions. -We have asked for records for pertinent exams, studies, vaccines and notes from previous providers. -We have advised patient to follow up per instructions below.  F/u prn in the next month   I discussed the assessment and treatment plan with the patient. The patient was provided an opportunity to ask questions and all were answered. The patient agreed with the plan and demonstrated an understanding of the instructions.   The patient was advised to call back or seek an in-person evaluation if the symptoms worsen or if the condition fails to improve as anticipated.   Billie Ruddy, MD

## 2018-11-03 ENCOUNTER — Other Ambulatory Visit: Payer: Self-pay | Admitting: Family Medicine

## 2018-11-03 DIAGNOSIS — I10 Essential (primary) hypertension: Secondary | ICD-10-CM

## 2018-11-03 DIAGNOSIS — Z1231 Encounter for screening mammogram for malignant neoplasm of breast: Secondary | ICD-10-CM | POA: Diagnosis not present

## 2018-11-03 LAB — HM MAMMOGRAPHY

## 2018-11-03 MED ORDER — LABETALOL HCL 200 MG PO TABS: 400.0000 mg | ORAL_TABLET | Freq: Two times a day (BID) | ORAL | 2 refills | Status: DC

## 2018-11-03 NOTE — Telephone Encounter (Signed)
Medication Refill - Medication: labetalol (NORMODYNE) 200 MG tablet    Preferred Pharmacy (with phone number or street name): [facility] ([address] [place] [id number] (Phone) [phone] (Fax)

## 2018-11-24 ENCOUNTER — Other Ambulatory Visit: Payer: Self-pay

## 2018-11-24 ENCOUNTER — Ambulatory Visit: Payer: Federal, State, Local not specified - PPO | Admitting: Family Medicine

## 2018-11-24 ENCOUNTER — Encounter: Payer: Self-pay | Admitting: Family Medicine

## 2018-11-24 VITALS — BP 138/78 | HR 82 | Temp 98.5°F | Wt 309.0 lb

## 2018-11-24 DIAGNOSIS — I10 Essential (primary) hypertension: Secondary | ICD-10-CM

## 2018-11-24 DIAGNOSIS — R600 Localized edema: Secondary | ICD-10-CM

## 2018-11-24 MED ORDER — NIFEDIPINE ER 60 MG PO TB24: 60.0000 mg | ORAL_TABLET | Freq: Every day | ORAL | 3 refills | Status: DC

## 2018-11-24 MED ORDER — LABETALOL HCL 200 MG PO TABS: 400.0000 mg | ORAL_TABLET | Freq: Two times a day (BID) | ORAL | 3 refills | Status: DC

## 2018-11-24 NOTE — Patient Instructions (Signed)
Managing Your Hypertension Hypertension is commonly called high blood pressure. This is when the force of your blood pressing against the walls of your arteries is too strong. Arteries are blood vessels that carry blood from your heart throughout your body. Hypertension forces the heart to work harder to pump blood, and may cause the arteries to become narrow or stiff. Having untreated or uncontrolled hypertension can cause heart attack, stroke, kidney disease, and other problems. What are blood pressure readings? A blood pressure reading consists of a higher number over a lower number. Ideally, your blood pressure should be below 120/80. The first ("top") number is called the systolic pressure. It is a measure of the pressure in your arteries as your heart beats. The second ("bottom") number is called the diastolic pressure. It is a measure of the pressure in your arteries as the heart relaxes. What does my blood pressure reading mean? Blood pressure is classified into four stages. Based on your blood pressure reading, your health care provider may use the following stages to determine what type of treatment you need, if any. Systolic pressure and diastolic pressure are measured in a unit called mm Hg. Normal  Systolic pressure: below 078.  Diastolic pressure: below 80. Elevated  Systolic pressure: 675-449.  Diastolic pressure: below 80. Hypertension stage 1  Systolic pressure: 201-007.  Diastolic pressure: 12-19. Hypertension stage 2  Systolic pressure: 758 or above.  Diastolic pressure: 90 or above. What health risks are associated with hypertension? Managing your hypertension is an important responsibility. Uncontrolled hypertension can lead to:  A heart attack.  A stroke.  A weakened blood vessel (aneurysm).  Heart failure.  Kidney damage.  Eye damage.  Metabolic syndrome.  Memory and concentration problems. What changes can I make to manage my  hypertension? Hypertension can be managed by making lifestyle changes and possibly by taking medicines. Your health care provider will help you make a plan to bring your blood pressure within a normal range. Eating and drinking   Eat a diet that is high in fiber and potassium, and low in salt (sodium), added sugar, and fat. An example eating plan is called the DASH (Dietary Approaches to Stop Hypertension) diet. To eat this way: ? Eat plenty of fresh fruits and vegetables. Try to fill half of your plate at each meal with fruits and vegetables. ? Eat whole grains, such as whole wheat pasta, brown rice, or whole grain bread. Fill about one quarter of your plate with whole grains. ? Eat low-fat diary products. ? Avoid fatty cuts of meat, processed or cured meats, and poultry with skin. Fill about one quarter of your plate with lean proteins such as fish, chicken without skin, beans, eggs, and tofu. ? Avoid premade and processed foods. These tend to be higher in sodium, added sugar, and fat.  Reduce your daily sodium intake. Most people with hypertension should eat less than 1,500 mg of sodium a day.  Limit alcohol intake to no more than 1 drink a day for nonpregnant women and 2 drinks a day for men. One drink equals 12 oz of beer, 5 oz of wine, or 1 oz of hard liquor. Lifestyle  Work with your health care provider to maintain a healthy body weight, or to lose weight. Ask what an ideal weight is for you.  Get at least 30 minutes of exercise that causes your heart to beat faster (aerobic exercise) most days of the week. Activities may include walking, swimming, or biking.  Include exercise  to strengthen your muscles (resistance exercise), such as weight lifting, as part of your weekly exercise routine. Try to do these types of exercises for 30 minutes at least 3 days a week.  Do not use any products that contain nicotine or tobacco, such as cigarettes and e-cigarettes. If you need help quitting,  ask your health care provider.  Control any long-term (chronic) conditions you have, such as high cholesterol or diabetes. Monitoring  Monitor your blood pressure at home as told by your health care provider. Your personal target blood pressure may vary depending on your medical conditions, your age, and other factors.  Have your blood pressure checked regularly, as often as told by your health care provider. Working with your health care provider  Review all the medicines you take with your health care provider because there may be side effects or interactions.  Talk with your health care provider about your diet, exercise habits, and other lifestyle factors that may be contributing to hypertension.  Visit your health care provider regularly. Your health care provider can help you create and adjust your plan for managing hypertension. Will I need medicine to control my blood pressure? Your health care provider may prescribe medicine if lifestyle changes are not enough to get your blood pressure under control, and if:  Your systolic blood pressure is 130 or higher.  Your diastolic blood pressure is 80 or higher. Take medicines only as told by your health care provider. Follow the directions carefully. Blood pressure medicines must be taken as prescribed. The medicine does not work as well when you skip doses. Skipping doses also puts you at risk for problems. Contact a health care provider if:  You think you are having a reaction to medicines you have taken.  You have repeated (recurrent) headaches.  You feel dizzy.  You have swelling in your ankles.  You have trouble with your vision. Get help right away if:  You develop a severe headache or confusion.  You have unusual weakness or numbness, or you feel faint.  You have severe pain in your chest or abdomen.  You vomit repeatedly.  You have trouble breathing. Summary  Hypertension is when the force of blood pumping  through your arteries is too strong. If this condition is not controlled, it may put you at risk for serious complications.  Your personal target blood pressure may vary depending on your medical conditions, your age, and other factors. For most people, a normal blood pressure is less than 120/80.  Hypertension is managed by lifestyle changes, medicines, or both. Lifestyle changes include weight loss, eating a healthy, low-sodium diet, exercising more, and limiting alcohol. This information is not intended to replace advice given to you by your health care provider. Make sure you discuss any questions you have with your health care provider. Document Released: 10/28/2011 Document Revised: 05/27/2018 Document Reviewed: 01/01/2016 Elsevier Patient Education  2020 [employer].  Peripheral Edema  Peripheral edema is swelling that is caused by a buildup of fluid. Peripheral edema most often affects the lower legs, ankles, and feet. It can also develop in the arms, hands, and face. The area of the body that has peripheral edema will look swollen. It may also feel heavy or warm. Your clothes may start to feel tight. Pressing on the area may make a temporary dent in your skin. You may not be able to move your swollen arm or leg as much as usual. There are many causes of peripheral edema. It can  happen because of a complication of other conditions such as congestive heart failure, kidney disease, or a problem with your blood circulation. It also can be a side effect of certain medicines or because of an infection. It often happens to women during pregnancy. Sometimes, the cause is not known. Follow these instructions at home: Managing pain, stiffness, and swelling   Raise (elevate) your legs while you are sitting or lying down.  Move around often to prevent stiffness and to lessen swelling.  Do not sit or stand for long periods of time.  Wear support stockings as told by your health care  provider. Medicines  Take over-the-counter and prescription medicines only as told by your health care provider.  Your health care provider may prescribe medicine to help your body get rid of excess water (diuretic). General instructions  Pay attention to any changes in your symptoms.  Follow instructions from your health care provider about limiting salt (sodium) in your diet. Sometimes, eating less salt may reduce swelling.  Moisturize skin daily to help prevent skin from cracking and draining.  Keep all follow-up visits as told by your health care provider. This is important. Contact a health care provider if you have:  A fever.  Edema that starts suddenly or is getting worse, especially if you are pregnant or have a medical condition.  Swelling in only one leg.  Increased swelling, redness, or pain in one or both of your legs.  Drainage or sores at the area where you have edema. Get help right away if you:  Develop shortness of breath, especially when you are lying down.  Have pain in your chest or abdomen.  Feel weak.  Feel faint. Summary  Peripheral edema is swelling that is caused by a buildup of fluid. Peripheral edema most often affects the lower legs, ankles, and feet.  Move around often to prevent stiffness and to lessen swelling. Do not sit or stand for long periods of time.  Pay attention to any changes in your symptoms.  Contact a health care provider if you have edema that starts suddenly or is getting worse, especially if you are pregnant or have a medical condition.  Get help right away if you develop shortness of breath, especially when lying down. This information is not intended to replace advice given to you by your health care provider. Make sure you discuss any questions you have with your health care provider. Document Released: 03/12/2004 Document Revised: 10/27/2017 Document Reviewed: 10/27/2017 Elsevier Patient Education  2020 [employer].

## 2018-11-24 NOTE — Progress Notes (Signed)
Subjective:    Patient ID: Kylie Robertson, female    DOB: 26-Dec-1975, 43 y.o.   MRN: [id number]  No chief complaint on file.   HPI Patient was seen today for follow-up on BP.  Pt endorses BP at home typically in the 130s/70s.  Pt denies headaches, blurred vision, chest pain, dizziness.  Pt currently taking labetalol 100 mg twice daily and nifedipine 60 mg daily.  Pt not eating much salt.  Endorses doing a lot of sitting since working from home.  Cooking meals at home, rarely eating out.  Pt requesting increase physical activity from the difficult having 2 young kids at home.  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  Pt also notes increased lower extremity edema at the end of the day, nonpainful.  Denies SOB, CP.  Past Medical History:  Diagnosis Date  . ASCUS (atypical squamous cells of undetermined significance) on Pap smear   . Chronic hypertension with superimposed preeclampsia 03/15/2018  . Eclampsia complicating pregnancy in third trimester 03/15/2018  . Endocervical polyp   . HSV-2 (herpes simplex virus 2) infection   . Hx of pre-eclampsia in prior pregnancy, currently pregnant   . Hypertension   . Osteoarthritis   . Pilonidal cyst   . Vaginal Pap smear, abnormal     Allergies  Allergen Reactions  . Lisinopril Cough    Dry cough    ROS General: Denies fever, chills, night sweats, changes in weight, changes in appetite HEENT: Denies headaches, ear pain, changes in vision, rhinorrhea, sore throat CV: Denies CP, palpitations, SOB, orthopnea  + lower extremity edema Pulm: Denies SOB, cough, wheezing GI: Denies abdominal pain, nausea, vomiting, diarrhea, constipation GU: Denies dysuria, hematuria, frequency, vaginal discharge Msk: Denies muscle cramps, joint pains Neuro: Denies weakness, numbness, tingling Skin: Denies rashes, bruising Psych: Denies depression, anxiety, hallucinations    Objective:    Blood pressure  138/78, pulse 82, temperature 98.5 F (36.9 C), temperature source Oral, weight (!) 309 lb (140.2 kg), SpO2 98 %, currently breastfeeding.   Gen. Pleasant, well-nourished, in no distress, normal affect   HEENT: [place]/AT, face symmetric, conjunctiva clear, no scleral icterus, PERRLA, nares patent without drainage Lungs: no accessory muscle use, CTAB, no wheezes or rales Cardiovascular: RRR, no m/r/g, trace peripheral edema Musculoskeletal: No deformities, no cyanosis or clubbing, normal tone Neuro:  A&Ox3, CN II-XII intact, normal gait Skin:  Warm, no lesions/ rash   Wt Readings from Last 3 Encounters:  11/24/18 (!) 309 lb (140.2 kg)  03/18/18 299 lb 4 oz (135.7 kg)  03/15/18 (!) 309 lb 6.4 oz (140.3 kg)    Lab Results  Component Value Date   WBC 9.2 03/18/2018   HGB 10.8 (L) 03/18/2018   HCT 33.4 (L) 03/18/2018   PLT 282 03/18/2018   GLUCOSE 94 03/18/2018   ALT 20 03/18/2018   AST 22 03/18/2018   NA 132 (L) 03/18/2018   K 4.5 03/18/2018   CL 103 03/18/2018   CREATININE 0.81 03/18/2018   BUN 12 03/18/2018   CO2 22 03/18/2018    Assessment/Plan:  Essential hypertension  -Improving -Discussed continuing lifestyle modifications and increasing physical activity. -Continue checking BP at home - Plan: labetalol (NORMODYNE) 200 MG tablet, NIFEdipine (ADALAT CC) 60 MG 24 hr tablet -Given handout -Given precautions  Bilateral lower extremity edema -Discussed elevating lower extremities, decreasing sodium intake, wearing compression socks or TED hose, as well as taking frequent breaks when working from home. -Discussed obtaining  BMP and BNP at next [facility] -Given handout -Given precautions  F/u PRN in 1 month for CPE  Grier Mitts, MD

## 2018-11-30 ENCOUNTER — Encounter: Payer: Self-pay | Admitting: Podiatry

## 2018-11-30 ENCOUNTER — Other Ambulatory Visit: Payer: Self-pay

## 2018-11-30 ENCOUNTER — Ambulatory Visit: Payer: Federal, State, Local not specified - PPO | Admitting: Podiatry

## 2018-11-30 VITALS — BP 128/81 | HR 63 | Resp 16

## 2018-11-30 DIAGNOSIS — B070 Plantar wart: Secondary | ICD-10-CM | POA: Diagnosis not present

## 2018-11-30 DIAGNOSIS — M2560 Stiffness of unspecified joint, not elsewhere classified: Secondary | ICD-10-CM | POA: Insufficient documentation

## 2018-11-30 DIAGNOSIS — B351 Tinea unguium: Secondary | ICD-10-CM

## 2018-11-30 NOTE — Progress Notes (Signed)
Subjective:   Patient ID: Kylie Robertson, female   DOB: 43 y.o.   MRN: [id number]   HPI Patient presents stating she is having small areas on the big toes of both feet that are hurting her right foot that a bit discolored and it is been about 10 months.  She is tried Dr. Felicie Morn pads without relief and patient does not smoke and likes to be active   Review of Systems  All other systems reviewed and are negative.       Objective:  Physical Exam Vitals signs and nursing note reviewed.  Constitutional:      Appearance: She is well-developed.  Pulmonary:     Effort: Pulmonary effort is normal.  Musculoskeletal: Normal range of motion.  Skin:    General: Skin is warm.  Neurological:     Mental Status: She is alert.     Neurovascular status intact muscle strength found to be adequate range of motion within normal limits.  I noted there to be a 5 x 5 mm lesion plantar aspect right hallux approximate 7 x 7 mm on the left hallux and on the left plantar foot approximate 1.5 x 1.5 cm lesion.  Upon debridement they all show pinpoint bleeding and they are painful to lateral pressure and I was able to take the top covering off of all of them.  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  Gave instructions on soaks and reappoint to recheck.  Do not recommend treatment of the nails signed visit

## 2018-11-30 NOTE — Progress Notes (Signed)
   Subjective:    Patient ID: Kylie Robertson, female    DOB: Feb 12, 1976, 43 y.o.   MRN: [id number]  HPI    Review of Systems  All other systems reviewed and are negative.      Objective:   Physical Exam        Assessment & Plan:

## 2018-12-18 ENCOUNTER — Other Ambulatory Visit: Payer: Self-pay

## 2018-12-18 ENCOUNTER — Encounter (HOSPITAL_COMMUNITY): Payer: Self-pay | Admitting: *Deleted

## 2018-12-18 ENCOUNTER — Ambulatory Visit (HOSPITAL_COMMUNITY)
Admission: EM | Admit: 2018-12-18 | Discharge: 2018-12-18 | Disposition: A | Discharge status: Home / Self Care | Payer: Federal, State, Local not specified - PPO | Attending: Family Medicine | Admitting: Family Medicine

## 2018-12-18 DIAGNOSIS — R079 Chest pain, unspecified: Secondary | ICD-10-CM

## 2018-12-18 DIAGNOSIS — R109 Unspecified abdominal pain: Secondary | ICD-10-CM | POA: Diagnosis not present

## 2018-12-18 DIAGNOSIS — M25512 Pain in left shoulder: Secondary | ICD-10-CM | POA: Diagnosis not present

## 2018-12-18 DIAGNOSIS — M549 Dorsalgia, unspecified: Secondary | ICD-10-CM | POA: Diagnosis not present

## 2018-12-18 NOTE — ED Triage Notes (Addendum)
C/O pain beneath left scapula and into chest intermittently over past couple days.  States feels like it may be gas; belching, passing gas, or having a BM seem to relieve the pain for a period of time.  Denies any pain at present.  Denies any n/v, diaphoresis, dizziness associated with pain.  Pain occasasionally radiates down into LUE.

## 2018-12-18 NOTE — ED Notes (Signed)
EKG shown to Dr. Sabra Heck.

## 2018-12-18 NOTE — Discharge Instructions (Addendum)
Avoid foods which produce gas such as spicy foods, melons, beans, cabbage, Brussels sprouts. May try simethicone preparations such as Gas-X or Maalox plus Follow-up with primary care physician

## 2018-12-18 NOTE — ED Provider Notes (Addendum)
[facility]    CSN: [id number] Arrival date & time: 12/18/18  1115      History   Chief Complaint Chief Complaint  Patient presents with  . Shoulder Pain  . Chest Pain    HPI Kylie Robertson is a 43 y.o. female.  Patient has some pain under her left scapula with radiation to the shoulder.  She also has noted some increase in eructation and gas.  Pain does seem to occur after eating.  She has had no prior surgery which involved removal of any organs such as gallbladder.   HPI  Past Medical History:  Diagnosis Date  . ASCUS (atypical squamous cells of undetermined significance) on Pap smear   . Chronic hypertension with superimposed preeclampsia 03/15/2018  . Eclampsia complicating pregnancy in third trimester 03/15/2018  . Endocervical polyp   . HSV-2 (herpes simplex virus 2) infection   . Hx of pre-eclampsia in prior pregnancy, currently pregnant   . Hypertension   . Osteoarthritis   . Pilonidal cyst   . Vaginal Pap smear, abnormal     Patient Active Problem List   Diagnosis Date Noted  . Limitation of joint movement 11/30/2018  . Seasonal allergies 07/18/2018  . 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 Chronic hypertension with superimposed preeclampsia 03/15/2018  . Postpartum care following cesarean delivery (1/28) 03/15/2018  . Previous cesarean section 03/15/2018  . Eclampsia complicating pregnancy in third trimester 03/15/2018  . Morbid obesity with BMI of 45.0-49.9, adult ([place]) 07/08/2017  . Pre-eclampsia superimposed on chronic hypertension, delivered, with postpartum complication [id number]  . Chronic hypertension 02/14/2017  . Morbid obesity ([place]) 11/16/2016  . Eczema 04/17/2016  . Seasonal allergic rhinitis 04/08/2016  . Blood in urine 07/29/2015  . HSV-2 (herpes simplex virus 2) infection     Past Surgical History:  Procedure Laterality Date  . CESAREAN SECTION N/A 02/12/2017   Procedure: Primary CESAREAN  SECTION;  Surgeon: Servando Salina, MD;  Location: [facility];  Service: Obstetrics;  Laterality: N/A;  EDD: 02/19/17  . CESAREAN SECTION N/A 03/15/2018   Procedure: Repeat CESAREAN SECTION;  Surgeon: Servando Salina, MD;  Location: [facility];  Service: Obstetrics;  Laterality: N/A;  EDD: 04/05/18  . HIP SURGERY    . WISDOM TOOTH EXTRACTION      OB History    Gravida  3   Para  2   Term  2   Preterm      AB  1   Living  2     SAB      TAB  1   Ectopic      Multiple  0   Live Births  2            Home Medications    Prior to Admission medications   Medication Sig Start Date End Date Taking? Authorizing Provider  labetalol (NORMODYNE) 200 MG tablet Take 2 tablets (400 mg total) by mouth 2 (two) times daily. 11/24/18  Yes Billie Ruddy, MD  NIFEdipine (ADALAT CC) 60 MG 24 hr tablet Take 1 tablet (60 mg total) by mouth daily. 11/24/18  Yes Billie Ruddy, MD    Family History Family History  Problem Relation Age of Onset  . Hypertension Paternal Grandfather   . Diabetes Paternal Grandfather   . Hypertension Paternal Grandmother   . Diabetes Paternal Grandmother   . Hypertension Maternal Grandmother   . Diabetes Maternal Grandmother   . Kidney disease Maternal Grandmother   .  Hypertension Maternal Grandfather   . Diabetes Maternal Grandfather   . Breast cancer Mother   . Heart attack Father   . Drug abuse Father     Social History Social History   Tobacco Use  . Smoking status: Never Smoker  . Smokeless tobacco: Never Used  Substance Use Topics  . Alcohol use: Not Currently  . Drug use: No     Allergies   Lisinopril   Review of Systems Review of Systems  Gastrointestinal: Positive for abdominal pain.       Increased gaseous symptoms  Musculoskeletal: Positive for back pain.     Physical Exam Triage Vital Signs ED Triage Vitals  Enc Vitals Group     BP 12/18/18 1154 128/87     Pulse Rate 12/18/18 1154 63      Resp --      Temp 12/18/18 1154 (!) 97.5 F (36.4 C)     Temp Source 12/18/18 1154 Other     SpO2 12/18/18 1154 100 %     Weight --      Height --      Head Circumference --      Peak Flow --      Pain Score 12/18/18 1155 0     Pain Loc --      Pain Edu? --      Excl. in [place]? --    No data found.  Updated Vital Signs BP 128/87   Pulse 63   Temp (!) 97.5 F (36.4 C) (Other (Comment))   LMP 11/26/2018 (Exact Date)   SpO2 100%   Breastfeeding No   Visual Acuity Right Eye Distance:   Left Eye Distance:   Bilateral Distance:    Right Eye Near:   Left Eye Near:    Bilateral Near:     Physical Exam Vitals signs and nursing note reviewed.  Constitutional:      Appearance: She is well-developed. She is obese.  Cardiovascular:     Rate and Rhythm: Normal rate and regular rhythm.     Heart sounds: Normal heart sounds.  Pulmonary:     Effort: Pulmonary effort is normal.  Abdominal:     General: Bowel sounds are normal.     Palpations: Abdomen is soft. There is no mass.     Tenderness: There is no abdominal tenderness.  Neurological:     Mental Status: She is alert.   EKG is normal   UC Treatments / Results  Labs (all labs ordered are listed, but only abnormal results are displayed) Labs Reviewed - No data to display  EKG   Radiology No results found.  Procedures Procedures (including critical care time)  Medications Ordered in UC Medications - No data to display  Initial Impression / Assessment and Plan / UC Course  I have reviewed the triage vital signs and the nursing notes.  Pertinent labs & imaging results that were available during my care of the patient were reviewed by me and considered in my medical decision making (see chart for details).     Left sided subscapular and shoulder pain.  Symptoms are suggestive of gallbladder disease.  Ultrasound of gallbladder might be appropriate.  Would also consider splenic flexure syndrome. Final Clinical  Impressions(s) / UC Diagnoses   Final diagnoses:  None   Discharge Instructions   None    ED Prescriptions    None     PDMP not reviewed this encounter.   Wardell Honour, MD 12/18/18 [phone]  Wardell Honour, MD 12/18/18 1236

## 2018-12-28 ENCOUNTER — Ambulatory Visit: Payer: Federal, State, Local not specified - PPO | Admitting: Podiatry

## 2018-12-28 ENCOUNTER — Encounter: Payer: Self-pay | Admitting: Podiatry

## 2018-12-28 ENCOUNTER — Other Ambulatory Visit: Payer: Self-pay

## 2018-12-28 DIAGNOSIS — B070 Plantar wart: Secondary | ICD-10-CM | POA: Diagnosis not present

## 2018-12-28 NOTE — Progress Notes (Signed)
Subjective:   Patient ID: Kylie Robertson, female   DOB: 43 y.o.   MRN: [id number]   HPI Patient states the lesions seem to be improving and I did not develop blistering afterwards but they are dark   ROS      Objective:  Physical Exam  Neurovascular status intact with keratotic lesion right and left hallux and plantar aspect left foot that upon debridement show previous bleeding into the areas consistent with probable verruca plantaris     Assessment:  Verruca plantaris present bilateral with improvement from previous treatment     Plan:  Sharp sterile debridement of all lesions and applied chemical agent to create immune response along with sterile dressings and reappoint as symptoms indicate and explained what to do if any blistering were to occur

## 2019-01-17 ENCOUNTER — Other Ambulatory Visit: Payer: Self-pay

## 2019-01-18 ENCOUNTER — Encounter: Payer: Self-pay | Admitting: Family Medicine

## 2019-01-18 ENCOUNTER — Ambulatory Visit (INDEPENDENT_AMBULATORY_CARE_PROVIDER_SITE_OTHER): Payer: Federal, State, Local not specified - PPO | Admitting: Family Medicine

## 2019-01-18 VITALS — BP 128/80 | HR 60 | Temp 98.1°F | Wt 310.0 lb

## 2019-01-18 DIAGNOSIS — Z13220 Encounter for screening for lipoid disorders: Secondary | ICD-10-CM

## 2019-01-18 DIAGNOSIS — Z6841 Body Mass Index (BMI) 40.0 and over, adult: Secondary | ICD-10-CM

## 2019-01-18 DIAGNOSIS — I10 Essential (primary) hypertension: Secondary | ICD-10-CM

## 2019-01-18 DIAGNOSIS — R600 Localized edema: Secondary | ICD-10-CM | POA: Diagnosis not present

## 2019-01-18 DIAGNOSIS — Z0000 Encounter for general adult medical examination without abnormal findings: Secondary | ICD-10-CM

## 2019-01-18 DIAGNOSIS — Z23 Encounter for immunization: Secondary | ICD-10-CM | POA: Diagnosis not present

## 2019-01-18 LAB — CBC WITH DIFFERENTIAL/PLATELET
Basophils Absolute: 0 10*3/uL (ref 0.0–0.1)
Basophils Relative: 0.7 % (ref 0.0–3.0)
Eosinophils Absolute: 0.2 10*3/uL (ref 0.0–0.7)
Eosinophils Relative: 3.5 % (ref 0.0–5.0)
HCT: 38 % (ref 36.0–46.0)
Hemoglobin: 12.4 g/dL (ref 12.0–15.0)
Lymphocytes Relative: 22 % (ref 12.0–46.0)
Lymphs Abs: 1.2 10*3/uL (ref 0.7–4.0)
MCHC: 32.6 g/dL (ref 30.0–36.0)
MCV: 83.7 fl (ref 78.0–100.0)
Monocytes Absolute: 0.5 10*3/uL (ref 0.1–1.0)
Monocytes Relative: 8.7 % (ref 3.0–12.0)
Neutro Abs: 3.5 10*3/uL (ref 1.4–7.7)
Neutrophils Relative %: 65.1 % (ref 43.0–77.0)
Platelets: 378 10*3/uL (ref 150.0–400.0)
RBC: 4.54 Mil/uL (ref 3.87–5.11)
RDW: 14.1 % (ref 11.5–15.5)
WBC: 5.4 10*3/uL (ref 4.0–10.5)

## 2019-01-18 LAB — BRAIN NATRIURETIC PEPTIDE: Pro B Natriuretic peptide (BNP): 68 pg/mL (ref 0.0–100.0)

## 2019-01-18 LAB — T4, FREE: Free T4: 0.88 ng/dL (ref 0.60–1.60)

## 2019-01-18 LAB — COMPREHENSIVE METABOLIC PANEL
ALT: 15 U/L (ref 0–35)
AST: 11 U/L (ref 0–37)
Albumin: 4.1 g/dL (ref 3.5–5.2)
Alkaline Phosphatase: 74 U/L (ref 39–117)
BUN: 9 mg/dL (ref 6–23)
CO2: 28 mEq/L (ref 19–32)
Calcium: 9.3 mg/dL (ref 8.4–10.5)
Chloride: 102 mEq/L (ref 96–112)
Creatinine, Ser: 0.73 mg/dL (ref 0.40–1.20)
GFR: 106.25 mL/min (ref >60.00)
Glucose, Bld: 101 mg/dL — ABNORMAL HIGH (ref 70–99)
Potassium: 4.3 mEq/L (ref 3.5–5.1)
Sodium: 138 mEq/L (ref 135–145)
Total Bilirubin: 0.6 mg/dL (ref 0.2–1.2)
Total Protein: 6.9 g/dL (ref 6.0–8.3)

## 2019-01-18 LAB — LIPID PANEL
Cholesterol: 200 mg/dL (ref 0–200)
HDL: 51.8 mg/dL (ref >39.00)
LDL Cholesterol: 129 mg/dL — ABNORMAL HIGH (ref 0–99)
NonHDL: 148.34
Total CHOL/HDL Ratio: 4
Triglycerides: 96 mg/dL (ref 0.0–149.0)
VLDL: 19.2 mg/dL (ref 0.0–40.0)

## 2019-01-18 LAB — TSH: TSH: 2.07 u[IU]/mL (ref 0.35–4.50)

## 2019-01-18 LAB — HEMOGLOBIN A1C: Hgb A1c MFr Bld: 6 % (ref 4.6–6.5)

## 2019-01-18 NOTE — Patient Instructions (Addendum)
Preventive Care 40-41 Years Old, Female Preventive care refers to visits with your health care provider and lifestyle choices that can promote health and wellness. This includes:  A yearly physical exam. This may also be called an annual well check.  Regular dental visits and eye exams.  Immunizations.  Screening for certain conditions.  Healthy lifestyle choices, such as eating a healthy diet, getting regular exercise, not using drugs or products that contain nicotine and tobacco, and limiting alcohol use. What can I expect for my preventive care visit? Physical exam Your health care provider will check your:  Height and weight. This may be used to calculate body mass index (BMI), which tells if you are at a healthy weight.  Heart rate and blood pressure.  Skin for abnormal spots. Counseling Your health care provider may ask you questions about your:  Alcohol, tobacco, and drug use.  Emotional well-being.  Home and relationship well-being.  Sexual activity.  Eating habits.  Work and work environment.  Method of birth control.  Menstrual cycle.  Pregnancy history. What immunizations do I need?  Influenza (flu) vaccine  This is recommended every year. Tetanus, diphtheria, and pertussis (Tdap) vaccine  You may need a Td booster every 10 years. Varicella (chickenpox) vaccine  You may need this if you have not been vaccinated. Zoster (shingles) vaccine  You may need this after age 60. Measles, mumps, and rubella (MMR) vaccine  You may need at least one dose of MMR if you were born in 1957 or later. You may also need a second dose. Pneumococcal conjugate (PCV13) vaccine  You may need this if you have certain conditions and were not previously vaccinated. Pneumococcal polysaccharide (PPSV23) vaccine  You may need one or two doses if you smoke cigarettes or if you have certain conditions. Meningococcal conjugate (MenACWY) vaccine  You may need this if you  have certain conditions. Hepatitis A vaccine  You may need this if you have certain conditions or if you travel or work in places where you may be exposed to hepatitis A. Hepatitis B vaccine  You may need this if you have certain conditions or if you travel or work in places where you may be exposed to hepatitis B. Haemophilus influenzae type b (Hib) vaccine  You may need this if you have certain conditions. Human papillomavirus (HPV) vaccine  If recommended by your health care provider, you may need three doses over 6 months. You may receive vaccines as individual doses or as more than one vaccine together in one shot (combination vaccines). Talk with your health care provider about the risks and benefits of combination vaccines. What tests do I need? Blood tests  Lipid and cholesterol levels. These may be checked every 5 years, or more frequently if you are over 50 years old.  Hepatitis C test.  Hepatitis B test. Screening  Lung cancer screening. You may have this screening every year starting at age 55 if you have a 30-pack-year history of smoking and currently smoke or have quit within the past 15 years.  Colorectal cancer screening. All adults should have this screening starting at age 50 and continuing until age 75. Your health care provider may recommend screening at age 45 if you are at increased risk. You will have tests every 1-10 years, depending on your results and the type of screening test.  Diabetes screening. This is done by checking your blood sugar (glucose) after you have not eaten for a while (fasting). You may have this   done every 1-3 years.  Mammogram. This may be done every 1-2 years. Talk with your health care provider about when you should start having regular mammograms. This may depend on whether you have a family history of breast cancer.  BRCA-related cancer screening. This may be done if you have a family history of breast, ovarian, tubal, or peritoneal  cancers.  Pelvic exam and Pap test. This may be done every 3 years starting at age 56. Starting at age 45, this may be done every 5 years if you have a Pap test in combination with an HPV test. Other tests  Sexually transmitted disease (STD) testing.  Bone density scan. This is done to screen for osteoporosis. You may have this scan if you are at high risk for osteoporosis. Follow these instructions at home: Eating and drinking  Eat a diet that includes fresh fruits and vegetables, whole grains, lean protein, and low-fat dairy.  Take vitamin and mineral supplements as recommended by your health care provider.  Do not drink alcohol if: ? Your health care provider tells you not to drink. ? You are pregnant, may be pregnant, or are planning to become pregnant.  If you drink alcohol: ? Limit how much you have to 0-1 drink a day. ? Be aware of how much alcohol is in your drink. In the [place] one drink equals one 12 oz bottle of beer (355 mL), one 5 oz glass of wine (148 mL), or one 1 oz glass of hard liquor (44 mL). Lifestyle  Take daily care of your teeth and gums.  Stay active. Exercise for at least 30 minutes on 5 or more days each week.  Do not use any products that contain nicotine or tobacco, such as cigarettes, e-cigarettes, and chewing tobacco. If you need help quitting, ask your health care provider.  If you are sexually active, practice safe sex. Use a condom or other form of birth control (contraception) in order to prevent pregnancy and STIs (sexually transmitted infections).  If told by your health care provider, take low-dose aspirin daily starting at age 52. What's next?  Visit your health care provider once a year for a well check visit.  Ask your health care provider how often you should have your eyes and teeth checked.  Stay up to date on all vaccines. This information is not intended to replace advice given to you by your health care provider. Make sure you  discuss any questions you have with your health care provider. Document Released: 03/01/2015 Document Revised: 10/14/2017 Document Reviewed: 10/14/2017 Elsevier Patient Education  2020 [place] Your Hypertension Hypertension is commonly called high blood pressure. This is when the force of your blood pressing against the walls of your arteries is too strong. Arteries are blood vessels that carry blood from your heart throughout your body. Hypertension forces the heart to work harder to pump blood, and may cause the arteries to become narrow or stiff. Having untreated or uncontrolled hypertension can cause heart attack, stroke, kidney disease, and other problems. What are blood pressure readings? A blood pressure reading consists of a higher number over a lower number. Ideally, your blood pressure should be below 120/80. The first ("top") number is called the systolic pressure. It is a measure of the pressure in your arteries as your heart beats. The second ("bottom") number is called the diastolic pressure. It is a measure of the pressure in your arteries as the heart relaxes. What does my blood pressure reading  mean? Blood pressure is classified into four stages. Based on your blood pressure reading, your health care provider may use the following stages to determine what type of treatment you need, if any. Systolic pressure and diastolic pressure are measured in a unit called mm Hg. Normal  Systolic pressure: below 409.  Diastolic pressure: below 80. Elevated  Systolic pressure: 811-914.  Diastolic pressure: below 80. Hypertension stage 1  Systolic pressure: 782-956.  Diastolic pressure: 21-30. Hypertension stage 2  Systolic pressure: 865 or above.  Diastolic pressure: 90 or above. What health risks are associated with hypertension? Managing your hypertension is an important responsibility. Uncontrolled hypertension can lead to:  A heart attack.  A stroke.  A  weakened blood vessel (aneurysm).  Heart failure.  Kidney damage.  Eye damage.  Metabolic syndrome.  Memory and concentration problems. What changes can I make to manage my hypertension? Hypertension can be managed by making lifestyle changes and possibly by taking medicines. Your health care provider will help you make a plan to bring your blood pressure within a normal range. Eating and drinking   Eat a diet that is high in fiber and potassium, and low in salt (sodium), added sugar, and fat. An example eating plan is called the DASH (Dietary Approaches to Stop Hypertension) diet. To eat this way: ? Eat plenty of fresh fruits and vegetables. Try to fill half of your plate at each meal with fruits and vegetables. ? Eat whole grains, such as whole wheat pasta, brown rice, or whole grain bread. Fill about one quarter of your plate with whole grains. ? Eat low-fat diary products. ? Avoid fatty cuts of meat, processed or cured meats, and poultry with skin. Fill about one quarter of your plate with lean proteins such as fish, chicken without skin, beans, eggs, and tofu. ? Avoid premade and processed foods. These tend to be higher in sodium, added sugar, and fat.  Reduce your daily sodium intake. Most people with hypertension should eat less than 1,500 mg of sodium a day.  Limit alcohol intake to no more than 1 drink a day for nonpregnant women and 2 drinks a day for men. One drink equals 12 oz of beer, 5 oz of wine, or 1 oz of hard liquor. Lifestyle  Work with your health care provider to maintain a healthy body weight, or to lose weight. Ask what an ideal weight is for you.  Get at least 30 minutes of exercise that causes your heart to beat faster (aerobic exercise) most days of the week. Activities may include walking, swimming, or biking.  Include exercise to strengthen your muscles (resistance exercise), such as weight lifting, as part of your weekly exercise routine. Try to do these  types of exercises for 30 minutes at least 3 days a week.  Do not use any products that contain nicotine or tobacco, such as cigarettes and e-cigarettes. If you need help quitting, ask your health care provider.  Control any long-term (chronic) conditions you have, such as high cholesterol or diabetes. Monitoring  Monitor your blood pressure at home as told by your health care provider. Your personal target blood pressure may vary depending on your medical conditions, your age, and other factors.  Have your blood pressure checked regularly, as often as told by your health care provider. Working with your health care provider  Review all the medicines you take with your health care provider because there may be side effects or interactions.  Talk with your health care  provider about your diet, exercise habits, and other lifestyle factors that may be contributing to hypertension.  Visit your health care provider regularly. Your health care provider can help you create and adjust your plan for managing hypertension. Will I need medicine to control my blood pressure? Your health care provider may prescribe medicine if lifestyle changes are not enough to get your blood pressure under control, and if:  Your systolic blood pressure is 130 or higher.  Your diastolic blood pressure is 80 or higher. Take medicines only as told by your health care provider. Follow the directions carefully. Blood pressure medicines must be taken as prescribed. The medicine does not work as well when you skip doses. Skipping doses also puts you at risk for problems. Contact a health care provider if:  You think you are having a reaction to medicines you have taken.  You have repeated (recurrent) headaches.  You feel dizzy.  You have swelling in your ankles.  You have trouble with your vision. Get help right away if:  You develop a severe headache or confusion.  You have unusual weakness or numbness, or you  feel faint.  You have severe pain in your chest or abdomen.  You vomit repeatedly.  You have trouble breathing. Summary  Hypertension is when the force of blood pumping through your arteries is too strong. If this condition is not controlled, it may put you at risk for serious complications.  Your personal target blood pressure may vary depending on your medical conditions, your age, and other factors. For most people, a normal blood pressure is less than 120/80.  Hypertension is managed by lifestyle changes, medicines, or both. Lifestyle changes include weight loss, eating a healthy, low-sodium diet, exercising more, and limiting alcohol. This information is not intended to replace advice given to you by your health care provider. Make sure you discuss any questions you have with your health care provider. Document Released: 10/28/2011 Document Revised: 05/27/2018 Document Reviewed: 01/01/2016 Elsevier Patient Education  2020 [place].  Peripheral Edema  Peripheral edema is swelling that is caused by a buildup of fluid. Peripheral edema most often affects the lower legs, ankles, and feet. It can also develop in the arms, hands, and face. The area of the body that has peripheral edema will look swollen. It may also feel heavy or warm. Your clothes may start to feel tight. Pressing on the area may make a temporary dent in your skin. You may not be able to move your swollen arm or leg as much as usual. There are many causes of peripheral edema. It can happen because of a complication of other conditions such as congestive heart failure, kidney disease, or a problem with your blood circulation. It also can be a side effect of certain medicines or because of an infection. It often happens to women during pregnancy. Sometimes, the cause is not known. Follow these instructions at home: Managing pain, stiffness, and swelling   Raise (elevate) your legs while you are sitting or lying down.   Move around often to prevent stiffness and to lessen swelling.  Do not sit or stand for long periods of time.  Wear support stockings as told by your health care provider. Medicines  Take over-the-counter and prescription medicines only as told by your health care provider.  Your health care provider may prescribe medicine to help your body get rid of excess water (diuretic). General instructions  Pay attention to any changes in your symptoms.  Follow instructions  from your health care provider about limiting salt (sodium) in your diet. Sometimes, eating less salt may reduce swelling.  Moisturize skin daily to help prevent skin from cracking and draining.  Keep all follow-up visits as told by your health care provider. This is important. Contact a health care provider if you have:  A fever.  Edema that starts suddenly or is getting worse, especially if you are pregnant or have a medical condition.  Swelling in only one leg.  Increased swelling, redness, or pain in one or both of your legs.  Drainage or sores at the area where you have edema. Get help right away if you:  Develop shortness of breath, especially when you are lying down.  Have pain in your chest or abdomen.  Feel weak.  Feel faint. Summary  Peripheral edema is swelling that is caused by a buildup of fluid. Peripheral edema most often affects the lower legs, ankles, and feet.  Move around often to prevent stiffness and to lessen swelling. Do not sit or stand for long periods of time.  Pay attention to any changes in your symptoms.  Contact a health care provider if you have edema that starts suddenly or is getting worse, especially if you are pregnant or have a medical condition.  Get help right away if you develop shortness of breath, especially when lying down. This information is not intended to replace advice given to you by your health care provider. Make sure you discuss any questions you have with  your health care provider. Document Released: 03/12/2004 Document Revised: 10/27/2017 Document Reviewed: 10/27/2017 Elsevier Patient Education  [place].  Preventing Unhealthy [employer], Adult Staying at a healthy weight is important to your overall health. When fat builds up in your body, you may become overweight or obese. Being overweight or obese increases your risk of developing certain health problems, such as heart disease, diabetes, sleeping problems, joint problems, and some types of cancer. Unhealthy weight gain is often the result of making unhealthy food choices or not getting enough exercise. You can make changes to your lifestyle to prevent obesity and stay as healthy as possible. What nutrition changes can be made?   Eat only as much as your body needs. To do this: ? Pay attention to signs that you are hungry or full. Stop eating as soon as you feel full. ? If you feel hungry, try drinking water first before eating. Drink enough water so your urine is clear or pale yellow. ? Eat smaller portions. Pay attention to portion sizes when eating out. ? Look at serving sizes on food labels. Most foods contain more than one serving per container. ? Eat the recommended number of calories for your gender and activity level. For most active people, a daily total of 2,000 calories is appropriate. If you are trying to lose weight or are not very active, you may need to eat fewer calories. Talk with your health care provider or a diet and nutrition specialist (dietitian) about how many calories you need each day.  Choose healthy foods, such as: ? Fruits and vegetables. At each meal, try to fill at least half of your plate with fruits and vegetables. ? Whole grains, such as whole-wheat bread, brown rice, and quinoa. ? Lean meats, such as chicken or fish. ? Other healthy proteins, such as beans, eggs, or tofu. ? Healthy fats, such as nuts, seeds, fatty fish, and olive oil. ? Low-fat  or fat-free dairy products.  Check food  labels, and avoid food and drinks that: ? Are high in calories. ? Have added sugar. ? Are high in sodium. ? Have saturated fats or trans fats.  Cook foods in healthier ways, such as by baking, broiling, or grilling.  Make a meal plan for the week, and shop with a grocery list to help you stay on track with your purchases. Try to avoid going to the grocery store when you are hungry.  When grocery shopping, try to shop around the outside of the store first, where the fresh foods are. Doing this helps you to avoid prepackaged foods, which can be high in sugar, salt (sodium), and fat. What lifestyle changes can be made?   Exercise for 30 or more minutes on 5 or more days each week. Exercising may include brisk walking, yard work, biking, running, swimming, and team sports like basketball and soccer. Ask your health care provider which exercises are safe for you.  Do muscle-strengthening activities, such as lifting weights or using resistance bands, on 2 or more days a week.  Do not use any products that contain nicotine or tobacco, such as cigarettes and e-cigarettes. If you need help quitting, ask your health care provider.  Limit alcohol intake to no more than 1 drink a day for nonpregnant women and 2 drinks a day for men. One drink equals 12 oz of beer, 5 oz of wine, or 1 oz of hard liquor.  Try to get 7-9 hours of sleep each night. What other changes can be made?  Keep a food and activity journal to keep track of: ? What you ate and how many calories you had. Remember to count the calories in sauces, dressings, and side dishes. ? Whether you were active, and what exercises you did. ? Your calorie, weight, and activity goals.  Check your weight regularly. Track any changes. If you notice you have gained weight, make changes to your diet or activity routine.  Avoid taking weight-loss medicines or supplements. Talk to your health care provider  before starting any new medicine or supplement.  Talk to your health care provider before trying any new diet or exercise plan. Why are these changes important? Eating healthy, staying active, and having healthy habits can help you to prevent obesity. Those changes also:  Help you manage stress and emotions.  Help you connect with friends and family.  Improve your self-esteem.  Improve your sleep.  Prevent long-term health problems. What can happen if changes are not made? Being obese or overweight can cause you to develop joint or bone problems, which can make it hard for you to stay active or do activities you enjoy. Being obese or overweight also puts stress on your heart and lungs and can lead to health problems like diabetes, heart disease, and some cancers. Where to find more information Talk with your health care provider or a dietitian about healthy eating and healthy lifestyle choices. You may also find information from:  [place] Department of Agriculture, My[employer]: FormerBoss.no  American Heart Association: www.heart.org  Centers for Disease Control and Prevention: http://www.wolf.info/ Summary  Staying at a healthy weight is important to your overall health. It helps you to prevent certain diseases and health problems, such as heart disease, diabetes, joint problems, sleep disorders, and some types of cancer.  Being obese or overweight can cause you to develop joint or bone problems, which can make it hard for you to stay active or do activities you enjoy.  You can prevent unhealthy weight  gain by eating a healthy diet, exercising regularly, not smoking, limiting alcohol, and getting enough sleep.  Talk with your health care provider or a dietitian for guidance about healthy eating and healthy lifestyle choices. This information is not intended to replace advice given to you by your health care provider. Make sure you discuss any questions you have with your health care  provider. Document Released: 02/04/2016 Document Revised: 02/05/2017 Document Reviewed: 03/11/2016 Elsevier Patient Education  2020 [employer].

## 2019-01-18 NOTE — Addendum Note (Signed)
Addended by: Wyvonne Lenz on: 01/18/2019 09:29 AM   Modules accepted: Orders

## 2019-01-18 NOTE — Progress Notes (Signed)
Subjective:     Kylie Robertson is a 43 y.o. female and is here for a comprehensive physical exam. The patient reports problems - Lower extremity edema.  Notices more at the end of the day.  Pt endorses increased sitting since working from home.  Trying to eat less salt.  Denies shortness of breath.  Recently seen in UC for chest pain/shoulder pain 2/2 gas.  Pt endorses increased belching and flatus at the time of visit.  Thinks she may have come into contact with dairy which causes the symptoms for her.  Pt advised to monitor for gallbladder disease in the future.  Pt found relief with Mylicon  Social History   Socioeconomic History  . Marital status: Married    Spouse name: Not on file  . Number of children: Not on file  . Years of education: Not on file  . Highest education level: Not on file  Occupational History  . Not on file  Social Needs  . Financial resource strain: Not hard at all  . Food insecurity    Worry: Never true    Inability: Never true  . Transportation needs    Medical: No    Non-medical: Not on file  Tobacco Use  . Smoking status: Never Smoker  . Smokeless tobacco: Never Used  Substance and Sexual Activity  . Alcohol use: Not Currently  . Drug use: No  . Sexual activity: Yes    Birth control/protection: Surgical    Comment: spouse had vasectomy  Lifestyle  . Physical activity    Days per week: Not on file    Minutes per session: Not on file  . Stress: To some extent  Relationships  . Social Herbalist on phone: Not on file    Gets together: Not on file    Attends religious service: Not on file    Active member of club or organization: Not on file    Attends meetings of clubs or organizations: Not on file    Relationship status: Not on file  . Intimate partner violence    Fear of current or ex partner: No    Emotionally abused: No    Physically abused: No    Forced sexual activity: No  Other Topics Concern  . Not on file   Social History Narrative  . Not on file   Health Maintenance  Topic Date Due  . INFLUENZA VACCINE  09/17/2018  . PAP SMEAR-Modifier  05/03/2021  . TETANUS/TDAP  10/18/2026  . HIV Screening  Completed    The following portions of the patient's history were reviewed and updated as appropriate: allergies, current medications, past family history, past medical history, past social history, past surgical history and problem list.  Review of Systems Pertinent items noted in HPI and remainder of comprehensive ROS otherwise negative.   Objective:    BP 128/80 (BP Location: Left Arm, Patient Position: Sitting, Cuff Size: Large)   Pulse 60   Temp 98.1 F (36.7 C) (Temporal)   Wt (!) 310 lb (140.6 kg)   LMP 01/18/2019 (Exact Date)   SpO2 99%   BMI 48.55 kg/m  General appearance: alert, cooperative and no distress Head: Normocephalic, without obvious abnormality, atraumatic Eyes: conjunctivae/corneas clear. PERRL, EOM's intact. Fundi benign. Ears: normal TM's and external ear canals both ears Nose: Nares normal. Septum midline. Mucosa normal. No drainage or sinus tenderness. Throat: lips, mucosa, and tongue normal; teeth and gums normal Neck: no adenopathy, no carotid bruit, no  JVD, supple, symmetrical, trachea midline and thyroid not enlarged, symmetric, no tenderness/mass/nodules Lungs: clear to auscultation bilaterally Heart: regular rate and rhythm, S1, S2 normal, no murmur, click, rub or gallop no lower extremity edema present. Abdomen: soft, non-tender; bowel sounds normal; no masses,  no organomegaly Extremities: extremities normal, atraumatic, no cyanosis or edema Pulses: 2+ and symmetric Skin: Skin color, texture, turgor normal. No rashes or lesions Lymph nodes: Cervical, supraclavicular, and axillary nodes normal. Neurologic: Alert and oriented X 3, normal strength and tone. Normal symmetric reflexes. Normal coordination and gait    Assessment:    Healthy female exam.       Plan:     Anticipatory guidance given including wearing seatbelts, smoke detectors in the home, increasing physical activity, increasing p.o. intake of water and vegetables. -will obtain labs -influenza vaccine given this visit. -pap up to date -mammogram up to date. -given handout -next CPE in 1 yr See After Visit Summary for Counseling Recommendations    Bilateral lower extremity edema  -Discussed elevating lower extremities, wearing TED hose, decreasing sodium intake, increasing physical activity - Plan: Brain Natriuretic Peptide, CMP  Essential hypertension -Controlled -Continue current medications including nifedipine 60 mg daily and labetalol 100 mg twice daily  Need for influenza vaccination -Given this visit  Screening for cholesterol level  - Plan: Lipid Panel  Class 3 severe obesity due to excess calories without serious comorbidity with body mass index (BMI) of 45.0 to 49.9 in adult [facility])  -continue increasing physical activity - Plan: TSH, T4, Free, Hemoglobin A1c  Follow-up in the next few months for HTN and lower extremity edema.  Next CPE in 1 year  Grier Mitts, MD

## 2019-01-20 ENCOUNTER — Encounter: Payer: Self-pay | Admitting: Family Medicine

## 2019-03-24 ENCOUNTER — Telehealth (INDEPENDENT_AMBULATORY_CARE_PROVIDER_SITE_OTHER): Payer: Federal, State, Local not specified - PPO | Admitting: Family Medicine

## 2019-03-24 DIAGNOSIS — R143 Flatulence: Secondary | ICD-10-CM

## 2019-03-24 NOTE — Progress Notes (Signed)
Virtual Visit via Video Note  I connected with Kylie Robertson on 03/24/19 at  1:30 PM EST by a video enabled telemedicine application 2/2 [id number] pandemic and verified that I am speaking with the correct person using two identifiers.  Location patient: home Location provider:work or home office Persons participating in the virtual visit: patient, provider  I discussed the limitations of evaluation and management by telemedicine and the availability of in person appointments. The patient expressed understanding and agreed to proceed.   HPI: Pt with increased gas and bloating.  Pt has not noted a pattern.  She stopped chewing gum, stopped eating diary, onions.   Denies constipation or diarrhea.  Belching and flatus provide relief.  Has started taking gas-x BID.  May have eggs, grits, sausage, cereal, oatmeal, salad, baked chicken, green beans.  Denies heart burn.     ROS: See pertinent positives and negatives per HPI.  Past Medical History:  Diagnosis Date  . ASCUS (atypical squamous cells of undetermined significance) on Pap smear   . Chronic hypertension with superimposed preeclampsia 03/15/2018  . Eclampsia complicating pregnancy in third trimester 03/15/2018  . Endocervical polyp   . HSV-2 (herpes simplex virus 2) infection   . Hx of pre-eclampsia in prior pregnancy, currently pregnant   . Hypertension   . Osteoarthritis   . Pilonidal cyst   . Vaginal Pap smear, abnormal     Past Surgical History:  Procedure Laterality Date  . CESAREAN SECTION N/A 02/12/2017   Procedure: Primary CESAREAN SECTION;  Surgeon: Servando Salina, MD;  Location: [place];  Service: Obstetrics;  Laterality: N/A;  EDD: 02/19/17  . CESAREAN SECTION N/A 03/15/2018   Procedure: Repeat CESAREAN SECTION;  Surgeon: Servando Salina, MD;  Location: [place];  Service: Obstetrics;  Laterality: N/A;  EDD: 04/05/18  . HIP SURGERY    . WISDOM TOOTH EXTRACTION      Family History   Problem Relation Age of Onset  . Hypertension Paternal Grandfather   . Diabetes Paternal Grandfather   . Hypertension Paternal Grandmother   . Diabetes Paternal Grandmother   . Hypertension Maternal Grandmother   . Diabetes Maternal Grandmother   . Kidney disease Maternal Grandmother   . Hypertension Maternal Grandfather   . Diabetes Maternal Grandfather   . Breast cancer Mother   . Heart attack Father   . Drug abuse Father     Current Outpatient Medications:  .  labetalol (NORMODYNE) 200 MG tablet, Take 2 tablets (400 mg total) by mouth 2 (two) times daily., Disp: 360 tablet, Rfl: 3 .  NIFEdipine (ADALAT CC) 60 MG 24 hr tablet, Take 1 tablet (60 mg total) by mouth daily., Disp: 90 tablet, Rfl: 3  EXAM:  VITALS per patient if applicable:  RR between 12-20 bpm   GENERAL: alert, oriented, appears well and in no acute distress  HEENT: atraumatic, conjunctiva clear, no obvious abnormalities on inspection of external nose and ears  NECK: normal movements of the head and neck  LUNGS: on inspection no signs of respiratory distress, breathing rate appears normal, no obvious gross SOB, gasping or wheezing  CV: no obvious cyanosis  MS: moves all visible extremities without noticeable abnormality  PSYCH/NEURO: pleasant and cooperative, no obvious depression or anxiety, speech and thought processing grossly intact  ASSESSMENT AND PLAN:  Discussed the following assessment and plan:  Flatus  -possibly 2/2 glueten in tolerance or other food sensitivity.  Also consider SIBO. -food diary -discussed low FODMAP -will test for gluten intolerance -for continued  symptoms discussed referral to GI - Plan: CBC with Differential/Platelet, Gluten Sensitivity Screen -given precautions  F/u prn   I discussed the assessment and treatment plan with the patient. The patient was provided an opportunity to ask questions and all were answered. The patient agreed with the plan and demonstrated an  understanding of the instructions.   The patient was advised to call back or seek an in-person evaluation if the symptoms worsen or if the condition fails to improve as anticipated.  Kylie Ruddy, MD

## 2019-03-27 ENCOUNTER — Other Ambulatory Visit: Payer: Self-pay

## 2019-03-28 ENCOUNTER — Other Ambulatory Visit: Payer: Self-pay

## 2019-03-28 ENCOUNTER — Other Ambulatory Visit (INDEPENDENT_AMBULATORY_CARE_PROVIDER_SITE_OTHER): Payer: Federal, State, Local not specified - PPO

## 2019-03-28 DIAGNOSIS — R143 Flatulence: Secondary | ICD-10-CM | POA: Diagnosis not present

## 2019-03-29 LAB — CBC WITH DIFFERENTIAL/PLATELET
Basophils Absolute: 0.2 10*3/uL — ABNORMAL HIGH (ref 0.0–0.1)
Basophils Relative: 3.3 % — ABNORMAL HIGH (ref 0.0–3.0)
Eosinophils Absolute: 0.2 10*3/uL (ref 0.0–0.7)
Eosinophils Relative: 3 % (ref 0.0–5.0)
HCT: 37.1 % (ref 36.0–46.0)
Hemoglobin: 11.9 g/dL — ABNORMAL LOW (ref 12.0–15.0)
Lymphocytes Relative: 27 % (ref 12.0–46.0)
Lymphs Abs: 1.8 10*3/uL (ref 0.7–4.0)
MCHC: 32.2 g/dL (ref 30.0–36.0)
MCV: 84 fl (ref 78.0–100.0)
Monocytes Absolute: 0.5 10*3/uL (ref 0.1–1.0)
Monocytes Relative: 7.6 % (ref 3.0–12.0)
Neutro Abs: 4 10*3/uL (ref 1.4–7.7)
Neutrophils Relative %: 59.1 % (ref 43.0–77.0)
Platelets: 398 10*3/uL (ref 150.0–400.0)
RBC: 4.42 Mil/uL (ref 3.87–5.11)
RDW: 14.2 % (ref 11.5–15.5)
WBC: 6.7 10*3/uL (ref 4.0–10.5)

## 2019-04-01 LAB — F004-IGE WHEAT: Wheat IgE: <0.1 kU/L

## 2019-04-01 LAB — ANTIGLIADIN IGG (NATIVE): Antigliadin IgG (native): 18 units (ref 0–19)

## 2019-04-01 LAB — GLUTEN SENSITIVITY SCREEN: tTG/DGP Screen: NEGATIVE

## 2019-04-01 LAB — NOTE:

## 2019-05-26 DIAGNOSIS — K080 Exfoliation of teeth due to systemic causes: Secondary | ICD-10-CM | POA: Diagnosis not present

## 2019-10-27 DIAGNOSIS — Z6841 Body Mass Index (BMI) 40.0 and over, adult: Secondary | ICD-10-CM | POA: Diagnosis not present

## 2019-10-27 DIAGNOSIS — M160 Bilateral primary osteoarthritis of hip: Secondary | ICD-10-CM | POA: Diagnosis not present

## 2019-12-07 ENCOUNTER — Telehealth: Payer: Self-pay | Admitting: Family Medicine

## 2019-12-07 NOTE — Telephone Encounter (Signed)
Pt is calling in stating that she needs a refill on Rx's labetalol (NORMODYNE) 200 MG and NIFEdipine (ADALAT CC) 60 MG  Pharm:  [facility] on [address]  Pt has a CPE scheduled for 01/19/2020 (Friday)  @ 9:30 with Dr. Volanda Napoleon.

## 2019-12-08 ENCOUNTER — Other Ambulatory Visit: Payer: Self-pay

## 2019-12-08 DIAGNOSIS — I10 Essential (primary) hypertension: Secondary | ICD-10-CM

## 2019-12-08 MED ORDER — LABETALOL HCL 200 MG PO TABS: 400.0000 mg | ORAL_TABLET | Freq: Two times a day (BID) | ORAL | 3 refills | Status: AC

## 2019-12-08 MED ORDER — NIFEDIPINE ER 60 MG PO TB24: 60.0000 mg | ORAL_TABLET | Freq: Every day | ORAL | 3 refills | Status: AC

## 2019-12-08 NOTE — Telephone Encounter (Signed)
Pt Rx was sent to her pharmacy

## 2019-12-27 DIAGNOSIS — Z1231 Encounter for screening mammogram for malignant neoplasm of breast: Secondary | ICD-10-CM | POA: Diagnosis not present

## 2020-01-05 ENCOUNTER — Telehealth: Payer: Self-pay | Admitting: Family Medicine

## 2020-01-05 NOTE — Telephone Encounter (Signed)
Spoke with pt advised to call her pharmacy for refill

## 2020-01-05 NOTE — Telephone Encounter (Signed)
Patient is calling and requesting a refill for labetalol sent to [facility] ([facility] [place]  Phone:  [phone] Fax:  [phone] CB is [phone]

## 2020-01-08 DIAGNOSIS — R87610 Atypical squamous cells of undetermined significance on cytologic smear of cervix (ASC-US): Secondary | ICD-10-CM | POA: Diagnosis not present

## 2020-01-08 DIAGNOSIS — Z01419 Encounter for gynecological examination (general) (routine) without abnormal findings: Secondary | ICD-10-CM | POA: Diagnosis not present

## 2020-01-17 DIAGNOSIS — R635 Abnormal weight gain: Secondary | ICD-10-CM | POA: Diagnosis not present

## 2020-01-19 ENCOUNTER — Encounter: Payer: Self-pay | Admitting: Family Medicine

## 2020-01-19 ENCOUNTER — Other Ambulatory Visit: Payer: Self-pay

## 2020-01-19 ENCOUNTER — Ambulatory Visit (INDEPENDENT_AMBULATORY_CARE_PROVIDER_SITE_OTHER): Payer: Federal, State, Local not specified - PPO | Admitting: Family Medicine

## 2020-01-19 VITALS — BP 110/74 | HR 62 | Temp 98.2°F | Ht 67.0 in | Wt 298.2 lb

## 2020-01-19 DIAGNOSIS — Z0001 Encounter for general adult medical examination with abnormal findings: Secondary | ICD-10-CM

## 2020-01-19 DIAGNOSIS — E7841 Elevated Lipoprotein(a): Secondary | ICD-10-CM

## 2020-01-19 DIAGNOSIS — Z23 Encounter for immunization: Secondary | ICD-10-CM | POA: Diagnosis not present

## 2020-01-19 DIAGNOSIS — E049 Nontoxic goiter, unspecified: Secondary | ICD-10-CM

## 2020-01-19 DIAGNOSIS — E041 Nontoxic single thyroid nodule: Secondary | ICD-10-CM

## 2020-01-19 DIAGNOSIS — I10 Essential (primary) hypertension: Secondary | ICD-10-CM

## 2020-01-19 DIAGNOSIS — Z6841 Body Mass Index (BMI) 40.0 and over, adult: Secondary | ICD-10-CM

## 2020-01-19 DIAGNOSIS — R140 Abdominal distension (gaseous): Secondary | ICD-10-CM | POA: Diagnosis not present

## 2020-01-19 MED ORDER — PANTOPRAZOLE SODIUM 20 MG PO TBEC: 20.0000 mg | DELAYED_RELEASE_TABLET | Freq: Every day | ORAL | 2 refills | Status: DC

## 2020-01-19 NOTE — Patient Instructions (Addendum)
Preventive Care 40-42 Years Old, Female Preventive care refers to visits with your health care provider and lifestyle choices that can promote health and wellness. This includes:  A yearly physical exam. This may also be called an annual well check.  Regular dental visits and eye exams.  Immunizations.  Screening for certain conditions.  Healthy lifestyle choices, such as eating a healthy diet, getting regular exercise, not using drugs or products that contain nicotine and tobacco, and limiting alcohol use. What can I expect for my preventive care visit? Physical exam Your health care provider will check your:  Height and weight. This may be used to calculate body mass index (BMI), which tells if you are at a healthy weight.  Heart rate and blood pressure.  Skin for abnormal spots. Counseling Your health care provider may ask you questions about your:  Alcohol, tobacco, and drug use.  Emotional well-being.  Home and relationship well-being.  Sexual activity.  Eating habits.  Work and work environment.  Method of birth control.  Menstrual cycle.  Pregnancy history. What immunizations do I need?  Influenza (flu) vaccine  This is recommended every year. Tetanus, diphtheria, and pertussis (Tdap) vaccine  You may need a Td booster every 10 years. Varicella (chickenpox) vaccine  You may need this if you have not been vaccinated. Zoster (shingles) vaccine  You may need this after age 60. Measles, mumps, and rubella (MMR) vaccine  You may need at least one dose of MMR if you were born in 1957 or later. You may also need a second dose. Pneumococcal conjugate (PCV13) vaccine  You may need this if you have certain conditions and were not previously vaccinated. Pneumococcal polysaccharide (PPSV23) vaccine  You may need one or two doses if you smoke cigarettes or if you have certain conditions. Meningococcal conjugate (MenACWY) vaccine  You may need this if you  have certain conditions. Hepatitis A vaccine  You may need this if you have certain conditions or if you travel or work in places where you may be exposed to hepatitis A. Hepatitis B vaccine  You may need this if you have certain conditions or if you travel or work in places where you may be exposed to hepatitis B. Haemophilus influenzae type b (Hib) vaccine  You may need this if you have certain conditions. Human papillomavirus (HPV) vaccine  If recommended by your health care provider, you may need three doses over 6 months. You may receive vaccines as individual doses or as more than one vaccine together in one shot (combination vaccines). Talk with your health care provider about the risks and benefits of combination vaccines. What tests do I need? Blood tests  Lipid and cholesterol levels. These may be checked every 5 years, or more frequently if you are over 50 years old.  Hepatitis C test.  Hepatitis B test. Screening  Lung cancer screening. You may have this screening every year starting at age 55 if you have a 30-pack-year history of smoking and currently smoke or have quit within the past 15 years.  Colorectal cancer screening. All adults should have this screening starting at age 50 and continuing until age 75. Your health care provider may recommend screening at age 45 if you are at increased risk. You will have tests every 1-10 years, depending on your results and the type of screening test.  Diabetes screening. This is done by checking your blood sugar (glucose) after you have not eaten for a while (fasting). You may have this   done every 1-3 years.  Mammogram. This may be done every 1-2 years. Talk with your health care provider about when you should start having regular mammograms. This may depend on whether you have a family history of breast cancer.  BRCA-related cancer screening. This may be done if you have a family history of breast, ovarian, tubal, or peritoneal  cancers.  Pelvic exam and Pap test. This may be done every 3 years starting at age 28. Starting at age 86, this may be done every 5 years if you have a Pap test in combination with an HPV test. Other tests  Sexually transmitted disease (STD) testing.  Bone density scan. This is done to screen for osteoporosis. You may have this scan if you are at high risk for osteoporosis. Follow these instructions at home: Eating and drinking  Eat a diet that includes fresh fruits and vegetables, whole grains, lean protein, and low-fat dairy.  Take vitamin and mineral supplements as recommended by your health care provider.  Do not drink alcohol if: ? Your health care provider tells you not to drink. ? You are pregnant, may be pregnant, or are planning to become pregnant.  If you drink alcohol: ? Limit how much you have to 0-1 drink a day. ? Be aware of how much alcohol is in your drink. In the [place] one drink equals one 12 oz bottle of beer (355 mL), one 5 oz glass of wine (148 mL), or one 1 oz glass of hard liquor (44 mL). Lifestyle  Take daily care of your teeth and gums.  Stay active. Exercise for at least 30 minutes on 5 or more days each week.  Do not use any products that contain nicotine or tobacco, such as cigarettes, e-cigarettes, and chewing tobacco. If you need help quitting, ask your health care provider.  If you are sexually active, practice safe sex. Use a condom or other form of birth control (contraception) in order to prevent pregnancy and STIs (sexually transmitted infections).  If told by your health care provider, take low-dose aspirin daily starting at age 45. What's next?  Visit your health care provider once a year for a well check visit.  Ask your health care provider how often you should have your eyes and teeth checked.  Stay up to date on all vaccines. This information is not intended to replace advice given to you by your health care provider. Make sure you  discuss any questions you have with your health care provider. Document Revised: 10/14/2017 Document Reviewed: 10/14/2017 Elsevier Patient Education  [place]  A goiter is an enlarged thyroid gland. The thyroid is located in the lower front of the neck. It makes hormones that affect many body parts and systems, including the system that affects how quickly the body burns fuel for energy (metabolism). Most goiters are painless and are not a cause for concern. Some goiters can affect the way your thyroid makes thyroid hormones. Goiters and conditions that cause goiters can be treated, if necessary. What are the causes? Common causes of this condition include:  Lack (deficiency) of a mineral called iodine. The thyroid gland uses iodine to make thyroid hormones.  Diseases that attack healthy cells in the body (autoimmune diseases) and affect thyroid function, such as Graves' disease or Hashimoto's disease. These diseases may cause the body to produce too much thyroid hormone (hyperthyroidism) or too little of the hormone (hypothyroidism).  Conditions that cause inflammation of the thyroid (thyroiditis).  One  or more small growths on the thyroid (nodular goiter). Other causes include:  Medical problems caused by abnormal genes that are passed from parent to child (genetic defects).  Thyroid injury or infection.  Tumors that may or may not be cancerous.  Pregnancy.  Certain medicines.  Exposure to radiation. In some cases, the cause may not be known. What increases the risk? This condition is more likely to develop in:  People who do not get enough iodine in their diet.  People who have a family history of goiter.  Women.  People who are older than age 3.  People who smoke tobacco.  People who have had exposure to radiation. What are the signs or symptoms? The main symptom of this condition is swelling in the lower, front part of the neck. This swelling  can range from a very small bump to a large lump. Other symptoms may include:  A tight feeling in the throat.  A hoarse voice.  Coughing.  Wheezing.  Difficulty swallowing or breathing.  Bulging veins in the neck.  Dizziness. When a goiter is the result of an overactive thyroid (hyperthyroidism), symptoms may also include:  Nervousness or restlessness.  Inability to tolerate heat.  Unexplained weight loss.  Diarrhea.  Change in the texture of hair or skin.  Changes in heartbeat, such as skipped beats, extra beats, or a rapid heart rate.  Loss of menstruation.  Shaky hands.  Increased appetite.  Sleep problems. When a goiter is the result of an underactive thyroid (hypothyroidism), symptoms may also include:  Feeling like you have no energy (lethargy).  Inability to tolerate cold.  Weight gain that is not explained by a change in diet or exercise habits.  Dry skin.  Coarse hair.  Irregular menstrual periods.  Constipation.  Sadness or depression.  Fatigue. In some cases, there may not be any symptoms and the thyroid hormone levels may be normal. How is this diagnosed? This condition may be diagnosed based on your symptoms, your medical history, and a physical exam. You may have tests, such as:  Blood tests to check thyroid function.  Imaging tests, such as: ? Ultrasound. ? CT scan. ? MRI. ? Thyroid scan.  Removal of a tissue sample (biopsy) of the goiter or any nodules. The sample will be tested to check for cancer. How is this treated? Treatment for this condition depends on the cause and your symptoms. Treatment may include:  Medicines to regulate thyroid hormone levels.  Anti-inflammatory medicines or steroid medicines, if the goiter is caused by inflammation.  Iodine supplements or changes to your diet, if the goiter is caused by iodine deficiency.  Radioactive iodine treatment.  Surgery to remove your thyroid. In some cases, you may  only need regular check-ups with your health care provider to monitor your condition, and you may not need treatment. Follow these instructions at home:  Follow instructions from your health care provider about any changes to your diet.  Take over-the-counter and prescription medicines only as told by your health care provider. These include supplements.  Do not use any products that contain nicotine or tobacco, such as cigarettes and e-cigarettes. If you need help quitting, ask your health care provider.  Keep all follow-up visits as told by your health care provider. This is important. Contact a health care provider if:  Your symptoms do not get better with treatment.  You have nausea, vomiting, or diarrhea. Get help right away if:  You have sudden, unexplained confusion or other mental  changes.  You have a fever.  You have chest pain.  You have trouble breathing or swallowing.  You suddenly become very weak.  You experience extreme restlessness.  You feel your heart racing. Summary  A goiter is an enlarged thyroid gland.  The thyroid gland is located in the lower front of the neck. It makes hormones that affect many body parts and systems, including the system that affects how quickly the body burns fuel for energy (metabolism).  The main symptom of this condition is swelling in the lower, front part of the neck. This swelling can range from a very small bump to a large lump.  Treatment for this condition depends on the cause and your symptoms. You may need medicines, supplements, or regular monitoring of your condition. This information is not intended to replace advice given to you by your health care provider. Make sure you discuss any questions you have with your health care provider. Document Revised: 01/15/2017 Document Reviewed: 10/29/2016 Elsevier Patient Education  [place].  Abdominal Bloating When you have abdominal bloating, your abdomen may feel  full, tight, or painful. It may also look bigger than normal or swollen (distended). Common causes of abdominal bloating include:  Swallowing air.  Constipation.  Problems digesting food.  Eating too much.  Irritable bowel syndrome. This is a condition that affects the large intestine.  Lactose intolerance. This is an inability to digest lactose, a natural sugar in dairy products.  Celiac disease. This is a condition that affects the ability to digest gluten, a protein found in some grains.  Gastroparesis. This is a condition that slows down the movement of food in the stomach and small intestine. It is more common in people with diabetes mellitus.  Gastroesophageal reflux disease (GERD). This is a digestive condition that makes stomach acid flow back into the esophagus.  Urinary retention. This means that the body is holding onto urine, and the bladder cannot be emptied all the way. Follow these instructions at home: Eating and drinking  Avoid eating too much.  Try not to swallow air while talking or eating.  Avoid eating while lying down.  Avoid these foods and drinks: ? Foods that cause gas, such as broccoli, cabbage, cauliflower, and baked beans. ? Carbonated drinks. ? Hard candy. ? Chewing gum. Medicines  Take over-the-counter and prescription medicines only as told by your health care provider.  Take probiotic medicines. These medicines contain live bacteria or yeasts that can help digestion.  Take coated peppermint oil capsules. Activity  Try to exercise regularly. Exercise may help to relieve bloating that is caused by gas and relieve constipation. General instructions  Keep all follow-up visits as told by your health care provider. This is important. Contact a health care provider if:  You have nausea and vomiting.  You have diarrhea.  You have abdominal pain.  You have unusual weight loss or weight gain.  You have severe pain, and medicines do not  help. Get help right away if:  You have severe chest pain.  You have trouble breathing.  You have shortness of breath.  You have trouble urinating.  You have darker urine than normal.  You have blood in your stools or have dark, tarry stools. Summary  Abdominal bloating means that the abdomen is swollen.  Common causes of abdominal bloating are swallowing air, constipation, and problems digesting food.  Avoid eating too much and avoid swallowing air.  Avoid foods that cause gas, carbonated drinks, hard candy, and  chewing gum. This information is not intended to replace advice given to you by your health care provider. Make sure you discuss any questions you have with your health care provider. Document Revised: 05/23/2018 Document Reviewed: 03/06/2016 Elsevier Patient Education  [place].  High Cholesterol  High cholesterol is a condition in which the blood has high levels of a white, waxy, fat-like substance (cholesterol). The human body needs small amounts of cholesterol. The liver makes all the cholesterol that the body needs. Extra (excess) cholesterol comes from the food that we eat. Cholesterol is carried from the liver by the blood through the blood vessels. If you have high cholesterol, deposits (plaques) may build up on the walls of your blood vessels (arteries). Plaques make the arteries narrower and stiffer. Cholesterol plaques increase your risk for heart attack and stroke. Work with your health care provider to keep your cholesterol levels in a healthy range. What increases the risk? This condition is more likely to develop in people who:  Eat foods that are high in animal fat (saturated fat) or cholesterol.  Are overweight.  Are not getting enough exercise.  Have a family history of high cholesterol. What are the signs or symptoms? There are no symptoms of this condition. How is this diagnosed? This condition may be diagnosed from the results of a  blood test.  If you are older than age 16, your health care provider may check your cholesterol every 4-6 years.  You may be checked more often if you already have high cholesterol or other risk factors for heart disease. The blood test for cholesterol measures:  "Bad" cholesterol (LDL cholesterol). This is the main type of cholesterol that causes heart disease. The desired level for LDL is less than 100.  "Good" cholesterol (HDL cholesterol). This type helps to protect against heart disease by cleaning the arteries and carrying the LDL away. The desired level for HDL is 60 or higher.  Triglycerides. These are fats that the body can store or burn for energy. The desired number for triglycerides is lower than 150.  Total cholesterol. This is a measure of the total amount of cholesterol in your blood, including LDL cholesterol, HDL cholesterol, and triglycerides. A healthy number is less than 200. How is this treated? This condition is treated with diet changes, lifestyle changes, and medicines. Diet changes  This may include eating more whole grains, fruits, vegetables, nuts, and fish.  This may also include cutting back on red meat and foods that have a lot of added sugar. Lifestyle changes  Changes may include getting at least 40 minutes of aerobic exercise 3 times a week. Aerobic exercises include walking, biking, and swimming. Aerobic exercise along with a healthy diet can help you maintain a healthy weight.  Changes may also include quitting smoking. Medicines  Medicines are usually given if diet and lifestyle changes have failed to reduce your cholesterol to healthy levels.  Your health care provider may prescribe a statin medicine. Statin medicines have been shown to reduce cholesterol, which can reduce the risk of heart disease. Follow these instructions at home: Eating and drinking If told by your health care provider:  Eat chicken (without skin), fish, veal, shellfish,  ground [place] breast, and round or loin cuts of red meat.  Do not eat fried foods or fatty meats, such as hot dogs and salami.  Eat plenty of fruits, such as apples.  Eat plenty of vegetables, such as broccoli, potatoes, and carrots.  Eat beans, peas,  and lentils.  Eat grains such as barley, rice, couscous, and bulgur wheat.  Eat pasta without cream sauces.  Use skim or nonfat milk, and eat low-fat or nonfat yogurt and cheeses.  Do not eat or drink whole milk, cream, ice cream, egg yolks, or hard cheeses.  Do not eat stick margarine or tub margarines that contain trans fats (also called partially hydrogenated oils).  Do not eat saturated tropical oils, such as coconut oil and palm oil.  Do not eat cakes, cookies, crackers, or other baked goods that contain trans fats.  General instructions  Exercise as directed by your health care provider. Increase your activity level with activities such as gardening, walking, and taking the stairs.  Take over-the-counter and prescription medicines only as told by your health care provider.  Do not use any products that contain nicotine or tobacco, such as cigarettes and e-cigarettes. If you need help quitting, ask your health care provider.  Keep all follow-up visits as told by your health care provider. This is important. Contact a health care provider if:  You are struggling to maintain a healthy diet or weight.  You need help to start on an exercise program.  You need help to stop smoking. Get help right away if:  You have chest pain.  You have trouble breathing. This information is not intended to replace advice given to you by your health care provider. Make sure you discuss any questions you have with your health care provider. Document Revised: 02/05/2017 Document Reviewed: 08/03/2015 Elsevier Patient Education  [place] Your Hypertension Hypertension is commonly called high blood pressure. This is when  the force of your blood pressing against the walls of your arteries is too strong. Arteries are blood vessels that carry blood from your heart throughout your body. Hypertension forces the heart to work harder to pump blood, and may cause the arteries to become narrow or stiff. Having untreated or uncontrolled hypertension can cause heart attack, stroke, kidney disease, and other problems. What are blood pressure readings? A blood pressure reading consists of a higher number over a lower number. Ideally, your blood pressure should be below 120/80. The first ("top") number is called the systolic pressure. It is a measure of the pressure in your arteries as your heart beats. The second ("bottom") number is called the diastolic pressure. It is a measure of the pressure in your arteries as the heart relaxes. What does my blood pressure reading mean? Blood pressure is classified into four stages. Based on your blood pressure reading, your health care provider may use the following stages to determine what type of treatment you need, if any. Systolic pressure and diastolic pressure are measured in a unit called mm Hg. Normal  Systolic pressure: below 595.  Diastolic pressure: below 80. Elevated  Systolic pressure: 638-756.  Diastolic pressure: below 80. Hypertension stage 1  Systolic pressure: 433-295.  Diastolic pressure: 18-84. Hypertension stage 2  Systolic pressure: 166 or above.  Diastolic pressure: 90 or above. What health risks are associated with hypertension? Managing your hypertension is an important responsibility. Uncontrolled hypertension can lead to:  A heart attack.  A stroke.  A weakened blood vessel (aneurysm).  Heart failure.  Kidney damage.  Eye damage.  Metabolic syndrome.  Memory and concentration problems. What changes can I make to manage my hypertension? Hypertension can be managed by making lifestyle changes and possibly by taking medicines. Your health  care provider will help you make a plan to  bring your blood pressure within a normal range. Eating and drinking   Eat a diet that is high in fiber and potassium, and low in salt (sodium), added sugar, and fat. An example eating plan is called the DASH (Dietary Approaches to Stop Hypertension) diet. To eat this way: ? Eat plenty of fresh fruits and vegetables. Try to fill half of your plate at each meal with fruits and vegetables. ? Eat whole grains, such as whole wheat pasta, brown rice, or whole grain bread. Fill about one quarter of your plate with whole grains. ? Eat low-fat diary products. ? Avoid fatty cuts of meat, processed or cured meats, and poultry with skin. Fill about one quarter of your plate with lean proteins such as fish, chicken without skin, beans, eggs, and tofu. ? Avoid premade and processed foods. These tend to be higher in sodium, added sugar, and fat.  Reduce your daily sodium intake. Most people with hypertension should eat less than 1,500 mg of sodium a day.  Limit alcohol intake to no more than 1 drink a day for nonpregnant women and 2 drinks a day for men. One drink equals 12 oz of beer, 5 oz of wine, or 1 oz of hard liquor. Lifestyle  Work with your health care provider to maintain a healthy body weight, or to lose weight. Ask what an ideal weight is for you.  Get at least 30 minutes of exercise that causes your heart to beat faster (aerobic exercise) most days of the week. Activities may include walking, swimming, or biking.  Include exercise to strengthen your muscles (resistance exercise), such as weight lifting, as part of your weekly exercise routine. Try to do these types of exercises for 30 minutes at least 3 days a week.  Do not use any products that contain nicotine or tobacco, such as cigarettes and e-cigarettes. If you need help quitting, ask your health care provider.  Control any long-term (chronic) conditions you have, such as high cholesterol or  diabetes. Monitoring  Monitor your blood pressure at home as told by your health care provider. Your personal target blood pressure may vary depending on your medical conditions, your age, and other factors.  Have your blood pressure checked regularly, as often as told by your health care provider. Working with your health care provider  Review all the medicines you take with your health care provider because there may be side effects or interactions.  Talk with your health care provider about your diet, exercise habits, and other lifestyle factors that may be contributing to hypertension.  Visit your health care provider regularly. Your health care provider can help you create and adjust your plan for managing hypertension. Will I need medicine to control my blood pressure? Your health care provider may prescribe medicine if lifestyle changes are not enough to get your blood pressure under control, and if:  Your systolic blood pressure is 130 or higher.  Your diastolic blood pressure is 80 or higher. Take medicines only as told by your health care provider. Follow the directions carefully. Blood pressure medicines must be taken as prescribed. The medicine does not work as well when you skip doses. Skipping doses also puts you at risk for problems. Contact a health care provider if:  You think you are having a reaction to medicines you have taken.  You have repeated (recurrent) headaches.  You feel dizzy.  You have swelling in your ankles.  You have trouble with your vision. Get help right away  if:  You develop a severe headache or confusion.  You have unusual weakness or numbness, or you feel faint.  You have severe pain in your chest or abdomen.  You vomit repeatedly.  You have trouble breathing. Summary  Hypertension is when the force of blood pumping through your arteries is too strong. If this condition is not controlled, it may put you at risk for serious  complications.  Your personal target blood pressure may vary depending on your medical conditions, your age, and other factors. For most people, a normal blood pressure is less than 120/80.  Hypertension is managed by lifestyle changes, medicines, or both. Lifestyle changes include weight loss, eating a healthy, low-sodium diet, exercising more, and limiting alcohol. This information is not intended to replace advice given to you by your health care provider. Make sure you discuss any questions you have with your health care provider. Document Revised: 05/27/2018 Document Reviewed: 01/01/2016 Elsevier Patient Education  [place].

## 2020-01-19 NOTE — Progress Notes (Signed)
Subjective:     Kylie Robertson is a 44 y.o. female and is here for a comprehensive physical exam. The patient reports sensation of heart beat in throat x 2-3 months.  More with gas.  Gas with onions and garlic.  Low FODMAP may help.  Taking labetalol 100 mg daily and nifedipine 60 mg daily.  LMP 12/29/2019.  Mammogram done 12/27/2019.  Pap up-to-date.  Patient had COVID vaccines.  Inquires about influenza vaccine. Seen by [facility].  Social History   Socioeconomic History  . Marital status: Married    Spouse name: Not on file  . Number of children: Not on file  . Years of education: Not on file  . Highest education level: Not on file  Occupational History  . Not on file  Tobacco Use  . Smoking status: Never Smoker  . Smokeless tobacco: Never Used  Vaping Use  . Vaping Use: Never used  Substance and Sexual Activity  . Alcohol use: Not Currently  . Drug use: No  . Sexual activity: Yes    Birth control/protection: Surgical    Comment: spouse had vasectomy  Other Topics Concern  . Not on file  Social History Narrative  . Not on file   Social Determinants of Health   Financial Resource Strain:   . Difficulty of Paying Living Expenses: Not on file  Food Insecurity:   . Worried About Charity fundraiser in the Last Year: Not on file  . Ran Out of Food in the Last Year: Not on file  Transportation Needs:   . Lack of Transportation (Medical): Not on file  . Lack of Transportation (Non-Medical): Not on file  Physical Activity:   . Days of Exercise per Week: Not on file  . Minutes of Exercise per Session: Not on file  Stress:   . Feeling of Stress : Not on file  Social Connections:   . Frequency of Communication with Friends and Family: Not on file  . Frequency of Social Gatherings with Friends and Family: Not on file  . Attends Religious Services: Not on file  . Active Member of Clubs or Organizations: Not on file  . Attends Archivist Meetings: Not  on file  . Marital Status: Not on file  Intimate Partner Violence:   . Fear of Current or Ex-Partner: Not on file  . Emotionally Abused: Not on file  . Physically Abused: Not on file  . Sexually Abused: Not on file   Health Maintenance  Topic Date Due  . Hepatitis C Screening  Never done  . COVID-19 Vaccine (1) Never done  . INFLUENZA VACCINE  09/17/2019  . PAP SMEAR-Modifier  05/03/2021  . TETANUS/TDAP  10/18/2026  . HIV Screening  Completed    The following portions of the patient's history were reviewed and updated as appropriate: allergies, current medications, past family history, past medical history, past social history, past surgical history and problem list.  Review of Systems Pertinent items noted in HPI and remainder of comprehensive ROS otherwise negative.   Objective:    BP 110/74 (BP Location: Left Arm, Patient Position: Sitting, Cuff Size: Large)   Pulse 62   Temp 98.2 F (36.8 C) (Oral)   Ht '5\' 7"'  (1.702 m)   Wt 298 lb 3.2 oz (135.3 kg)   LMP 12/29/2019 (Exact Date)   SpO2 99%   BMI 46.70 kg/m  General appearance: alert, cooperative and no distress Head: Normocephalic, without obvious abnormality, atraumatic Eyes: conjunctivae/corneas clear.  PERRL, EOM's intact. Fundi benign. Ears: normal TM's and external ear canals both ears Nose: Nares normal. Septum midline. Mucosa normal. No drainage or sinus tenderness. Throat: lips, mucosa, and tongue normal; teeth and gums normal Neck: no adenopathy, no carotid bruit, no JVD, supple, symmetrical, trachea midline and thyroid enlarged, symmetric, no tenderness.  R lower lobe with  a 5 mm nodule. Lungs: clear to auscultation bilaterally Heart: regular rate and rhythm, S1, S2 normal, no murmur, click, rub or gallop Abdomen: soft, non-tender; bowel sounds normal; no masses,  no organomegaly Extremities: extremities normal, atraumatic, no cyanosis or edema Pulses: 2+ and symmetric Skin: Skin color, texture, turgor  normal. No rashes or lesions Lymph nodes: Cervical, supraclavicular, and axillary nodes normal. Neurologic: Alert and oriented X 3, normal strength and tone. Normal symmetric reflexes. Normal coordination and gait    Assessment:    Healthy female exam with goiter and thyroid nodule     Plan:     Anticipatory guidance given including wearing seatbelts, smoke detectors in the home, increasing physical activity, increasing p.o. intake of water and vegetables. -will obtain labs -mammogram up to date.  Done 12/27/19 -pap up to date per pt. -given handout -next CPE in 1 yr. See After Visit Summary for Counseling Recommendations    Thyroid nodule  -5 mm nodule in R lobe inferiorly -will obtain labs and imaging. - Plan: US THYROID, CBC with Differential/Platelet, TSH, T4, free  Goiter  - Plan: US THYROID, CBC with Differential/Platelet, TSH, T4, free  Elevated lipoprotein(a)  -discussed lifestyle modifications - Plan: Lipid panel  Class III severe obesity due to excess calories with serious comorbidity and BMI 45.0 to 49.9 in adult [facility])  -continue lifestyle modifications -continue f/u with [facility] - Plan: CMP with eGFR([facility]), Hemoglobin A1c  Abdominal bloating  -discussed possible causes -discussed diet changes including low FODMAP - Plan: Vitamin B12, Hemoglobin A1c, pantoprazole (PROTONIX) 20 MG tablet  Essential hypertension -controlled -continue labetalol 400 mg and nifedipine 60 mg -continue lifestyle modifications  Need for influenza vaccination  - Plan: Flu Vaccine QUAD 6+ mos PF IM (Fluarix Quad PF)  F/u prn in 1 month  Grier Mitts, MD

## 2020-01-20 LAB — COMPLETE METABOLIC PANEL WITH GFR
AG Ratio: 1.4 (calc) (ref 1.0–2.5)
ALT: 15 U/L (ref 6–29)
AST: 14 U/L (ref 10–30)
Albumin: 4.1 g/dL (ref 3.6–5.1)
Alkaline phosphatase (APISO): 68 U/L (ref 31–125)
BUN: 11 mg/dL (ref 7–25)
CO2: 25 mmol/L (ref 20–32)
Calcium: 9.5 mg/dL (ref 8.6–10.2)
Chloride: 104 mmol/L (ref 98–110)
Creat: 0.78 mg/dL (ref 0.50–1.10)
GFR, Est African American: 109 mL/min/{1.73_m2} (ref >=60)
GFR, Est Non African American: 94 mL/min/{1.73_m2} (ref >=60)
Globulin: 3 g/dL (calc) (ref 1.9–3.7)
Glucose, Bld: 95 mg/dL (ref 65–99)
Potassium: 4.8 mmol/L (ref 3.5–5.3)
Sodium: 138 mmol/L (ref 135–146)
Total Bilirubin: 0.6 mg/dL (ref 0.2–1.2)
Total Protein: 7.1 g/dL (ref 6.1–8.1)

## 2020-01-20 LAB — LIPID PANEL
Cholesterol: 198 mg/dL (ref <200)
HDL: 58 mg/dL (ref >=50)
LDL Cholesterol (Calc): 126 mg/dL (calc) — ABNORMAL HIGH
Non-HDL Cholesterol (Calc): 140 mg/dL (calc) — ABNORMAL HIGH (ref <130)
Total CHOL/HDL Ratio: 3.4 (calc) (ref <5.0)
Triglycerides: 46 mg/dL (ref <150)

## 2020-01-20 LAB — CBC WITH DIFFERENTIAL/PLATELET
Absolute Monocytes: 502 cells/uL (ref 200–950)
Basophils Absolute: 40 cells/uL (ref 0–200)
Basophils Relative: 0.7 %
Eosinophils Absolute: 160 cells/uL (ref 15–500)
Eosinophils Relative: 2.8 %
HCT: 39.4 % (ref 35.0–45.0)
Hemoglobin: 12.6 g/dL (ref 11.7–15.5)
Lymphs Abs: 1402 cells/uL (ref 850–3900)
MCH: 26.7 pg — ABNORMAL LOW (ref 27.0–33.0)
MCHC: 32 g/dL (ref 32.0–36.0)
MCV: 83.5 fL (ref 80.0–100.0)
MPV: 10.2 fL (ref 7.5–12.5)
Monocytes Relative: 8.8 %
Neutro Abs: 3597 cells/uL (ref 1500–7800)
Neutrophils Relative %: 63.1 %
Platelets: 388 10*3/uL (ref 140–400)
RBC: 4.72 10*6/uL (ref 3.80–5.10)
RDW: 13 % (ref 11.0–15.0)
Total Lymphocyte: 24.6 %
WBC: 5.7 10*3/uL (ref 3.8–10.8)

## 2020-01-20 LAB — HEMOGLOBIN A1C
Hgb A1c MFr Bld: 5.8 % of total Hgb — ABNORMAL HIGH (ref <5.7)
Mean Plasma Glucose: 120 (calc)
eAG (mmol/L): 6.6 (calc)

## 2020-01-20 LAB — TSH: TSH: 2.43 mIU/L

## 2020-01-20 LAB — VITAMIN B12: Vitamin B-12: 394 pg/mL (ref 200–1100)

## 2020-01-20 LAB — T4, FREE: Free T4: 1.2 ng/dL (ref 0.8–1.8)

## 2020-01-25 DIAGNOSIS — R7303 Prediabetes: Secondary | ICD-10-CM | POA: Diagnosis not present

## 2020-01-25 DIAGNOSIS — Z6841 Body Mass Index (BMI) 40.0 and over, adult: Secondary | ICD-10-CM | POA: Diagnosis not present

## 2020-01-25 DIAGNOSIS — E669 Obesity, unspecified: Secondary | ICD-10-CM | POA: Diagnosis not present

## 2020-01-25 DIAGNOSIS — R635 Abnormal weight gain: Secondary | ICD-10-CM | POA: Diagnosis not present

## 2020-01-25 DIAGNOSIS — I10 Essential (primary) hypertension: Secondary | ICD-10-CM | POA: Diagnosis not present

## 2020-02-01 ENCOUNTER — Ambulatory Visit
Admission: RE | Admit: 2020-02-01 | Discharge: 2020-02-01 | Disposition: A | Discharge status: Home / Self Care | Payer: Federal, State, Local not specified - PPO | Source: Ambulatory Visit | Attending: Family Medicine | Admitting: Family Medicine

## 2020-02-01 DIAGNOSIS — E041 Nontoxic single thyroid nodule: Secondary | ICD-10-CM | POA: Diagnosis not present

## 2020-02-01 DIAGNOSIS — E049 Nontoxic goiter, unspecified: Secondary | ICD-10-CM

## 2020-03-05 ENCOUNTER — Other Ambulatory Visit: Payer: Self-pay

## 2020-03-05 ENCOUNTER — Ambulatory Visit
Admission: EM | Admit: 2020-03-05 | Discharge: 2020-03-05 | Disposition: A | Discharge status: Home / Self Care | Payer: Federal, State, Local not specified - PPO | Attending: Emergency Medicine | Admitting: Emergency Medicine

## 2020-03-05 DIAGNOSIS — Z20822 Contact with and (suspected) exposure to covid-19: Secondary | ICD-10-CM | POA: Diagnosis not present

## 2020-03-05 DIAGNOSIS — B349 Viral infection, unspecified: Secondary | ICD-10-CM | POA: Diagnosis not present

## 2020-03-05 MED ORDER — IBUPROFEN 800 MG PO TABS: 800.0000 mg | ORAL_TABLET | Freq: Three times a day (TID) | ORAL | 0 refills | Status: DC

## 2020-03-05 MED ORDER — ONDANSETRON 4 MG PO TBDP: 4.0000 mg | ORAL_TABLET | Freq: Three times a day (TID) | ORAL | 0 refills | Status: DC | PRN

## 2020-03-05 NOTE — ED Triage Notes (Signed)
Pt states last night vomit x2, 4 normal BM's, nausea, dry mouth, lower abdominal pain, lt side pain and chills. States today having chills, muscle fatigue, body aches and dry mouth.

## 2020-03-05 NOTE — ED Provider Notes (Signed)
Tell her Kylie Robertson [facility]    CSN: [id number] Arrival date & time: 03/05/20  [place]      History   Chief Complaint Chief Complaint  Patient presents with  . Generalized Body Aches    HPI Kylie Robertson is a 45 y.o. female presenting today for evaluation of body aches and vomiting.  Patient reports last night had 2 episodes of vomiting.  Had more frequent bowel movements than normal.  Has had associated dry mouth of abdominal pain with chills.  Has had continued body aches fatigue and chills today.  Denies further nausea/vomiting, oral intake today.  Denies URI symptoms.  Denies close sick contacts.  HPI  Past Medical History:  Diagnosis Date  . ASCUS (atypical squamous cells of undetermined significance) on Pap smear   . Chronic hypertension with superimposed preeclampsia 03/15/2018  . Eclampsia complicating pregnancy in third trimester 03/15/2018  . Endocervical polyp   . HSV-2 (herpes simplex virus 2) infection   . Hx of pre-eclampsia in prior pregnancy, currently pregnant   . Hypertension   . Osteoarthritis   . Pilonidal cyst   . Vaginal Pap smear, abnormal     Patient Active Problem List   Diagnosis Date Noted  . Limitation of joint movement 11/30/2018  . Seasonal allergies 07/18/2018  . 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 Chronic hypertension with superimposed preeclampsia 03/15/2018  . Postpartum care following cesarean delivery (1/28) 03/15/2018  . Previous cesarean section 03/15/2018  . Eclampsia complicating pregnancy in third trimester 03/15/2018  . Morbid obesity with BMI of 45.0-49.9, adult ([place]) 07/08/2017  . Pre-eclampsia superimposed on chronic hypertension, delivered, with postpartum complication [id number]  . Chronic hypertension 02/14/2017  . Morbid obesity ([place]) 11/16/2016  . Eczema 04/17/2016  . Seasonal allergic rhinitis 04/08/2016  . Blood in urine 07/29/2015  . HSV-2 (herpes simplex virus 2) infection      Past Surgical History:  Procedure Laterality Date  . CESAREAN SECTION N/A 02/12/2017   Procedure: Primary CESAREAN SECTION;  Surgeon: Servando Salina, MD;  Location: [place];  Service: Obstetrics;  Laterality: N/A;  EDD: 02/19/17  . CESAREAN SECTION N/A 03/15/2018   Procedure: Repeat CESAREAN SECTION;  Surgeon: Servando Salina, MD;  Location: [place];  Service: Obstetrics;  Laterality: N/A;  EDD: 04/05/18  . HIP SURGERY    . WISDOM TOOTH EXTRACTION      OB History    Gravida  3   Para  2   Term  2   Preterm      AB  1   Living  2     SAB      IAB  1   Ectopic      Multiple  0   Live Births  2            Home Medications    Prior to Admission medications   Medication Sig Start Date End Date Taking? Authorizing Provider  ibuprofen (ADVIL) 800 MG tablet Take 1 tablet (800 mg total) by mouth 3 (three) times daily. 03/05/20  Yes Mehdi Gironda C, PA-C  ondansetron (ZOFRAN ODT) 4 MG disintegrating tablet Take 1 tablet (4 mg total) by mouth every 8 (eight) hours as needed for nausea or vomiting. 03/05/20  Yes Brandell Maready C, PA-C  labetalol (NORMODYNE) 200 MG tablet Take 2 tablets (400 mg total) by mouth 2 (two) times daily. 12/08/19   Billie Ruddy, MD  NIFEdipine (ADALAT CC) 60 MG 24 hr tablet Take  1 tablet (60 mg total) by mouth daily. 12/08/19   Billie Ruddy, MD  pantoprazole (PROTONIX) 20 MG tablet Take 1 tablet (20 mg total) by mouth daily. 01/19/20   Billie Ruddy, MD    Family History Family History  Problem Relation Age of Onset  . Hypertension Paternal Grandfather   . Diabetes Paternal Grandfather   . Hypertension Paternal Grandmother   . Diabetes Paternal Grandmother   . Hypertension Maternal Grandmother   . Diabetes Maternal Grandmother   . Kidney disease Maternal Grandmother   . Hypertension Maternal Grandfather   . Diabetes Maternal Grandfather   . Breast cancer Mother   . Heart attack Father   . Drug  abuse Father     Social History Social History   Tobacco Use  . Smoking status: Never Smoker  . Smokeless tobacco: Never Used  Vaping Use  . Vaping Use: Never used  Substance Use Topics  . Alcohol use: Not Currently  . Drug use: No     Allergies   Lisinopril   Review of Systems Review of Systems  Constitutional: Positive for chills, fatigue and fever. Negative for activity change and appetite change.  HENT: Negative for congestion, ear pain, rhinorrhea, sinus pressure, sore throat and trouble swallowing.   Eyes: Negative for discharge and redness.  Respiratory: Negative for cough, chest tightness and shortness of breath.   Cardiovascular: Negative for chest pain.  Gastrointestinal: Negative for abdominal pain, diarrhea, nausea and vomiting.  Musculoskeletal: Positive for myalgias.  Skin: Negative for rash.  Neurological: Negative for dizziness, light-headedness and headaches.     Physical Exam Triage Vital Signs ED Triage Vitals  Enc Vitals Group     BP 03/05/20 1851 117/74     Pulse Rate 03/05/20 1851 (!) 102     Resp 03/05/20 1851 18     Temp 03/05/20 1851 (!) 100.7 F (38.2 C)     Temp Source 03/05/20 1851 Oral     SpO2 03/05/20 1851 99 %     Weight --      Height --      Head Circumference --      Peak Flow --      Pain Score 03/05/20 1852 0     Pain Loc --      Pain Edu? --      Excl. in [place]? --    No data found.  Updated Vital Signs BP 117/74 (BP Location: Left Arm)   Pulse (!) 102   Temp (!) 100.7 F (38.2 C) (Oral)   Resp 18   LMP 02/18/2020   SpO2 99%   Breastfeeding No   Visual Acuity Right Eye Distance:   Left Eye Distance:   Bilateral Distance:    Right Eye Near:   Left Eye Near:    Bilateral Near:     Physical Exam Vitals and nursing note reviewed.  Constitutional:      Appearance: She is well-developed and well-nourished.     Comments: No acute distress  HENT:     Head: Normocephalic and atraumatic.     Ears:      Comments: Bilateral ears without tenderness to palpation of external auricle, tragus and mastoid, EAC's without erythema or swelling, TM's with good bony landmarks and cone of light. Non erythematous.     Nose: Nose normal.     Mouth/Throat:     Comments: Oral mucosa pink and moist, no tonsillar enlargement or exudate. Posterior pharynx patent and nonerythematous, no uvula deviation  or swelling. Normal phonation. Eyes:     Conjunctiva/sclera: Conjunctivae normal.  Cardiovascular:     Rate and Rhythm: Normal rate.  Pulmonary:     Effort: Pulmonary effort is normal. No respiratory distress.     Comments: Breathing comfortably at rest, CTABL, no wheezing, rales or other adventitious sounds auscultated Abdominal:     General: There is no distension.  Musculoskeletal:        General: Normal range of motion.     Cervical back: Neck supple.  Skin:    General: Skin is warm and dry.  Neurological:     Mental Status: She is alert and oriented to person, place, and time.  Psychiatric:        Mood and Affect: Mood and affect normal.      UC Treatments / Results  Labs (all labs ordered are listed, but only abnormal results are displayed) Labs Reviewed  COVID-19, FLU A+B NAA    EKG   Radiology No results found.  Procedures Procedures (including critical care time)  Medications Ordered in UC Medications - No data to display  Initial Impression / Assessment and Plan / UC Course  I have reviewed the triage vital signs and the nursing notes.  Pertinent labs & imaging results that were available during my care of the patient were reviewed by me and considered in my medical decision making (see chart for details).     Fever today, suspect likely viral illness-COVID/flu test pending, continue symptomatic and supportive care rest and fluids with continued monitoring.  Discussed strict return precautions. Patient verbalized understanding and is agreeable with plan.  Final Clinical  Impressions(s) / UC Diagnoses   Final diagnoses:  Encounter for screening laboratory testing for COVID-19 virus  Viral illness     Discharge Instructions     COVID/flu test pending Ibuprofen and Tylenol to control fever, headaches, body aches Zofran as needed for any nausea/vomiting Rest and fluids Follow-up if not improving or worsening    ED Prescriptions    Medication Sig Dispense Auth. Provider   ondansetron (ZOFRAN ODT) 4 MG disintegrating tablet Take 1 tablet (4 mg total) by mouth every 8 (eight) hours as needed for nausea or vomiting. 20 tablet Damek Ende C, PA-C   ibuprofen (ADVIL) 800 MG tablet Take 1 tablet (800 mg total) by mouth 3 (three) times daily. 21 tablet Lolah Coghlan, [place] C, PA-C     PDMP not reviewed this encounter.   Janith Lima, [place] 03/05/20 1914

## 2020-03-05 NOTE — Discharge Instructions (Signed)
COVID/flu test pending Ibuprofen and Tylenol to control fever, headaches, body aches Zofran as needed for any nausea/vomiting Rest and fluids Follow-up if not improving or worsening

## 2020-03-08 LAB — COVID-19, FLU A+B NAA
Influenza A, NAA: NOT DETECTED
Influenza B, NAA: NOT DETECTED
SARS-CoV-2, NAA: NOT DETECTED

## 2020-05-03 DIAGNOSIS — Z6841 Body Mass Index (BMI) 40.0 and over, adult: Secondary | ICD-10-CM | POA: Diagnosis not present

## 2020-05-03 DIAGNOSIS — Z713 Dietary counseling and surveillance: Secondary | ICD-10-CM | POA: Diagnosis not present

## 2020-05-17 DIAGNOSIS — M545 Low back pain, unspecified: Secondary | ICD-10-CM | POA: Diagnosis not present

## 2020-05-17 DIAGNOSIS — M160 Bilateral primary osteoarthritis of hip: Secondary | ICD-10-CM | POA: Diagnosis not present

## 2020-09-09 DIAGNOSIS — I10 Essential (primary) hypertension: Secondary | ICD-10-CM | POA: Diagnosis not present

## 2020-09-09 DIAGNOSIS — R7303 Prediabetes: Secondary | ICD-10-CM | POA: Diagnosis not present

## 2020-10-22 ENCOUNTER — Encounter: Payer: Self-pay | Admitting: Internal Medicine

## 2020-11-14 ENCOUNTER — Ambulatory Visit: Payer: Federal, State, Local not specified - PPO | Admitting: Internal Medicine

## 2020-11-18 ENCOUNTER — Ambulatory Visit: Payer: Federal, State, Local not specified - PPO | Admitting: Internal Medicine

## 2020-11-18 ENCOUNTER — Other Ambulatory Visit: Payer: Federal, State, Local not specified - PPO

## 2020-11-18 ENCOUNTER — Encounter: Payer: Self-pay | Admitting: Internal Medicine

## 2020-11-18 VITALS — BP 122/84 | HR 68 | Ht 67.0 in | Wt 278.2 lb

## 2020-11-18 DIAGNOSIS — R140 Abdominal distension (gaseous): Secondary | ICD-10-CM | POA: Diagnosis not present

## 2020-11-18 DIAGNOSIS — R1013 Epigastric pain: Secondary | ICD-10-CM | POA: Diagnosis not present

## 2020-11-18 MED ORDER — SIMETHICONE 80 MG PO CHEW: 80.0000 mg | CHEWABLE_TABLET | Freq: Four times a day (QID) | ORAL | 0 refills | Status: AC | PRN

## 2020-11-18 NOTE — Patient Instructions (Addendum)
If you are age 43 or older, your body mass index should be between 23-30. Your Body mass index is 43.58 kg/m. If this is out of the aforementioned range listed, please consider follow up with your Primary Care Provider.  If you are age 32 or younger, your body mass index should be between 19-25. Your Body mass index is 43.58 kg/m. If this is out of the aformentioned range listed, please consider follow up with your Primary Care Provider.   __________________________________________________________  The [facility] providers would like to encourage you to use [facility] to communicate with providers for non-urgent requests or questions.  Due to long hold times on the telephone, sending your provider a message by [facility] may be a faster and more efficient way to get a response.  Please allow 48 business hours for a response.  Please remember that this is for non-urgent requests.   You have been given a testing kit to check for small intestine bacterial overgrowth (SIBO) which is completed by a company named [facility]. Make sure to return your test in the mail using the return mailing label given to you along with the kit. Your demographic and insurance information have already been sent to the company and they should be in contact with you over the next week regarding this test. [facility] will collect an upfront charge of $99.74 for commercial insurance plans and $209.74 is you are paying cash. Make sure to discuss with [facility] PRIOR to having the test if they have gotten informatoin from your insurance company as to how much your testing will cost out of pocket, if any. Please keep in mind that you will be getting a call from phone number [phone] or a similar number. If you do not hear from them within this time frame, please call our office at [phone].   Please purchase over the counter simethicone chewables as use as needed.   Continue using your FODMAP.  Your provider  has requested that you go to the basement level for lab work before leaving today. Press "B" on the elevator. The lab is located at the first door on the left as you exit the elevator.  Due to recent changes in healthcare laws, you may see the results of your imaging and laboratory studies on MyChart before your provider has had a chance to review them.  We understand that in some cases there may be results that are confusing or concerning to you. Not all laboratory results come back in the same time frame and the provider may be waiting for multiple results in order to interpret others.  Please give [place] 48 hours in order for your provider to thoroughly review all the results before contacting the office for clarification of your results.    I appreciate the opportunity to care for you. Dayna Barker M.D.

## 2020-11-18 NOTE — Progress Notes (Signed)
Chief Complaint: Bloating  HPI : 45 year old female with history of obesity and HTN presents with bloating  She first noticed gas and bloating starting in 2014. She tried to adjust her diet to see if this would help and saw a GI physician who performed an EGD in 2014 that was unremarkable. She then started to avoid dairy, which seemed to help. She was pregnant in 2018 and had horrible acid reflux at that time. The more weight that she gained, the worse her reflux got. She was instructed to take an OTC reflux medication at that time. Had another pregnancy in 2019 and also had had horrible gas issues. She feels the gas build up and she can sometimes feel it in her shoulders. When she moves around, sometimes this bloating sensation will get better after she burps or passes gas. This issue with bloating is intermittent in nature. She was seen by her PCP in 01/2020 when she was recommended for a low FODMAP diet, and started on pantoprazole 20 mg QD. PPI did not help at all after she tried it for a month. The low FODMAP diet has been helpful to her. Onions and garlic appear to make her symptoms worse. She was tested for celiac disease and was negative. When she has gas issues, she will have slightly looser stools. Will have some discomfort in her upper abdomen. She has on average 2-3 BMs per day. When she has a menstrual period, she does feel like the bloating feels worse. Denies chest burning, regurgitation, N&V, dysphagia, odynophagia, unintentional weight loss. Denies melena, hematochezia. Denies fam hx of GI cancers. Has had two C-sections. Denies NSAID use.   Past Medical History:  Diagnosis Date   ASCUS (atypical squamous cells of undetermined significance) on Pap smear    Chronic hypertension with superimposed preeclampsia 03/15/2018   Eclampsia complicating pregnancy in third trimester 03/15/2018   Endocervical polyp    HSV-2 (herpes simplex virus 2) infection    Hx of pre-eclampsia in prior  pregnancy, currently pregnant    Hypertension    Osteoarthritis    Pilonidal cyst    Vaginal Pap smear, abnormal     Past Surgical History:  Procedure Laterality Date   CESAREAN SECTION N/A 02/12/2017   Procedure: Primary CESAREAN SECTION;  Surgeon: Servando Salina, MD;  Location: [place];  Service: Obstetrics;  Laterality: N/A;  EDD: 02/19/17   CESAREAN SECTION N/A 03/15/2018   Procedure: Repeat CESAREAN SECTION;  Surgeon: Servando Salina, MD;  Location: [facility];  Service: Obstetrics;  Laterality: N/A;  EDD: 04/05/18   HIP SURGERY     WISDOM TOOTH EXTRACTION     Family History  Problem Relation Age of Onset   Breast cancer Mother    Heart disease Father    Heart attack Father    Drug abuse Father    Diabetes Maternal Aunt    Diabetes Maternal Uncle    Hypertension Maternal Grandmother    Diabetes Maternal Grandmother    Kidney disease Maternal Grandmother    Hypertension Maternal Grandfather    Diabetes Maternal Grandfather    Hypertension Paternal Grandmother    Heart disease Paternal Grandfather    Hypertension Paternal Grandfather    Colon cancer Neg Hx    Esophageal cancer Neg Hx    Pancreatic cancer Neg Hx    Stomach cancer Neg Hx    Social History   Tobacco Use   Smoking status: Never   Smokeless tobacco: Never  Vaping Use  Vaping Use: Never used  Substance Use Topics   Alcohol use: Not Currently    Comment: very rare   Drug use: No   Current Outpatient Medications  Medication Sig Dispense Refill   labetalol (NORMODYNE) 200 MG tablet Take 2 tablets (400 mg total) by mouth 2 (two) times daily. 360 tablet 3   NIFEdipine (ADALAT CC) 60 MG 24 hr tablet Take 1 tablet (60 mg total) by mouth daily. 90 tablet 3   No current facility-administered medications for this visit.   Allergies  Allergen Reactions   Lisinopril Cough    Dry cough    Review of Systems: All systems reviewed and negative except where noted in HPI.    Physical Exam: BP 122/84   Pulse 68   Ht 5\' 7"  (1.702 m)   Wt 278 lb 4 oz (126.2 kg)   SpO2 100%   BMI 43.58 kg/m  Constitutional: Pleasant,well-developed, female in no acute distress. HEENT: Normocephalic and atraumatic. Conjunctivae are normal. No scleral icterus. Cardiovascular: Normal rate, regular rhythm.  Pulmonary/chest: Effort normal and breath sounds normal. No wheezing, rales or rhonchi. Abdominal: Soft, nondistended, nontender. Bowel sounds active throughout. There are no masses palpable. No hepatomegaly. Extremities: No edema Neurological: Alert and oriented to person place and time. Skin: Skin is warm and dry. No rashes noted. Psychiatric: Normal mood and affect. Behavior is normal.  Labs 03/2019: Negative TTG  Labs 01/2020: CBC nml. CMP unremarkable. HbA1C 5.8%, nml TSH  ASSESSMENT AND PLAN: Bloating Epigastric ab discomfort Patient has had longstanding issues with bloating and epigastric abdominal discomfort.  She has found some relationship of the symptoms with intake of dairy and certain foods such as garlic and onions.  Denies issues with constipation and GERD (patient did not respond to PPI).  At this time I will plan to rule out SIBO and H. pylori infection.  Otherwise will focus on symptomatic management with continued dietary vigilance and Gas-X as needed. - Start simethicone 80 mg QID PRN - Continue low FODMAP diet - Plan for hydrogen breath test - Check stool H pylori test  Christia Reading, MD

## 2020-11-20 ENCOUNTER — Other Ambulatory Visit: Payer: Federal, State, Local not specified - PPO

## 2020-11-20 DIAGNOSIS — R1013 Epigastric pain: Secondary | ICD-10-CM | POA: Diagnosis not present

## 2020-11-20 DIAGNOSIS — R140 Abdominal distension (gaseous): Secondary | ICD-10-CM

## 2020-11-22 LAB — H. PYLORI ANTIGEN, STOOL: H pylori Ag, Stl: NEGATIVE

## 2020-12-13 ENCOUNTER — Other Ambulatory Visit: Payer: Self-pay

## 2020-12-13 ENCOUNTER — Encounter: Payer: Self-pay | Admitting: Internal Medicine

## 2020-12-13 DIAGNOSIS — K580 Irritable bowel syndrome with diarrhea: Secondary | ICD-10-CM

## 2020-12-13 DIAGNOSIS — R140 Abdominal distension (gaseous): Secondary | ICD-10-CM

## 2020-12-13 DIAGNOSIS — R1013 Epigastric pain: Secondary | ICD-10-CM

## 2020-12-13 MED ORDER — RIFAXIMIN 550 MG PO TABS: 550.0000 mg | ORAL_TABLET | Freq: Three times a day (TID) | ORAL | 0 refills | Status: AC

## 2020-12-13 NOTE — Progress Notes (Signed)
PRIOR AUTHORIZATION  PA initiation date: 12/13/20  Medication: [place]: [employer] Submission completed electronically through [employer] My Meds: Yes  Will await insurance response re: approval/denial.  Kylie Robertson Kylie Robertson (Key: [id number])  Your information has been submitted to [place]. To check for an updated outcome later, reopen this PA request from your dashboard.  If [employer] has not responded to your request within 24 hours, contact [place] at [phone]. If you think there may be a problem with your PA request, use our live chat feature at the bottom right.

## 2020-12-13 NOTE — Progress Notes (Signed)
Called pt and informed of results and recommendations as reviewed and documented by Dr. Lorenso Courier. Verbalized acceptance and understanding. Pt scheduled for f/u 01/17/21 @ 8:30am. Will initiate PA for Xifaxan.

## 2020-12-13 NOTE — Progress Notes (Signed)
Received records from her hydrogen breath test, which were positive for SIBO. Collected 12/03/20. Increase in hydrogen was noted at 53 ppm (nml <20 ppm) and increase in methane was 4 (nml <12 ppm).   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 Then order rifaximin 550 mg TID for 14 days. Recommend putting IBS as a diagnosis to try to get it covered. If rifaximin is not covered or too expensive, then can prescribe Flagyl 250 mg TID for 10 days. Please schedule a follow up appointment in 1 month with me to see how she is doing.

## 2020-12-16 NOTE — Progress Notes (Signed)
APPROVAL  Medication: [employer]: [employer] PA response: Approved Approval dates:  11/13/2020 through 12/13/2021 Misc. Notes: Kylie Robertson (Key: [id number])  This request has received an approval. View the bottom of the request for an electronic copy of the approval letter.  Letter has been printed, labeled and placed in scan file for our future reference and for scanning purposes.

## 2020-12-17 ENCOUNTER — Emergency Department (HOSPITAL_COMMUNITY)
Admission: EM | Admit: 2020-12-17 | Discharge: 2021-01-16 | Disposition: E | Payer: Federal, State, Local not specified - PPO | Attending: Emergency Medicine | Admitting: Emergency Medicine

## 2020-12-17 DIAGNOSIS — S0180XA Unspecified open wound of other part of head, initial encounter: Secondary | ICD-10-CM

## 2020-12-17 DIAGNOSIS — I469 Cardiac arrest, cause unspecified: Secondary | ICD-10-CM

## 2020-12-17 DIAGNOSIS — W1830XA Fall on same level, unspecified, initial encounter: Secondary | ICD-10-CM | POA: Diagnosis not present

## 2020-12-17 DIAGNOSIS — R001 Bradycardia, unspecified: Secondary | ICD-10-CM | POA: Diagnosis not present

## 2020-12-17 DIAGNOSIS — S0990XA Unspecified injury of head, initial encounter: Secondary | ICD-10-CM | POA: Diagnosis not present

## 2020-12-17 DIAGNOSIS — R109 Unspecified abdominal pain: Secondary | ICD-10-CM | POA: Insufficient documentation

## 2020-12-17 DIAGNOSIS — I10 Essential (primary) hypertension: Secondary | ICD-10-CM | POA: Insufficient documentation

## 2020-12-17 DIAGNOSIS — R404 Transient alteration of awareness: Secondary | ICD-10-CM | POA: Diagnosis not present

## 2020-12-17 DIAGNOSIS — S0181XA Laceration without foreign body of other part of head, initial encounter: Secondary | ICD-10-CM | POA: Diagnosis not present

## 2021-01-16 NOTE — ED Notes (Signed)
[employer] notified regarding circumstances involving death.

## 2021-01-16 NOTE — Consult Note (Addendum)
Reason for consultation: Unconscious and cardiac arrest after falling out of a stopped car   Consulting physician: Godfrey Pick, MD  Patient was reportedly found unconscious after falling out of a stopped car in a driveway.  She was in cardiac arrest upon arrival of EMS.  She was initially transported as a nontrauma code activation.  On arrival, she was noted to have some bruising and abrasions to her forehead and was upgraded to a level 1 per EDP discretion.  On my arrival, [place] device was ongoing.  CPR had been in process for a total of 50 minutes.  CPR was paused.  There was no cardiac activity seen on ultrasound.  Seen findings including rhythm changes and attempts at cardioversion were reviewed with EMS at the bedside.  Pupils were fixed and dilated.  GCS 3.  Patient was pronounced.  Please refer to EDP note for time of death.  Georganna Skeans, MD, MPH, FACS Please use [email or web address] to contact on call provider

## 2021-01-16 NOTE — ED Triage Notes (Addendum)
Pt arrived at 1016 by EMS. EMS reports they were called out for unresponsive pt found by family face down on the ground. Unknown down time.Pt was by her car in the driveway, the door was open and it appeared she fell out of the car.  Pt was in asystole initially, pt received 2 doses of epi, pt then was found to be in V-fib and shocked 3 times. EMS reports pt was then in asystole then PEA then asystole again. CPR in progress upon arrival to ED. Pt received a total of 10 rounds of epi in the field and 3 shocks. CPR in progress for 50 minutes upon pt arrival to ED. IO in place in Ltib, [place] airway in place, BVM, 1500 NS administered

## 2021-01-16 NOTE — Progress Notes (Signed)
Orthopedic Tech Progress Note Patient Details:  MALAN WERK [birth year] [id number]  Patient ID: Vaughan Sine, female   DOB: June 05, 1975, 45 y.o.   MRN: [id number]  Responded to level 1.  No ortho tech intervention needed.  Thanks,  Verdene Lennert, PT, DPT  Acute Rehabilitation Ortho Tech Supervisor [phone] pager #[phone] office     Wells Guiles B Neriyah Cercone 08-Jan-2021, 10:36 AM

## 2021-01-16 NOTE — ED Notes (Signed)
Pt belongings in pt belongings bag with pt inside of the body bag. Pt transported down to the morgue.

## 2021-01-16 NOTE — ED Notes (Addendum)
[facility] contacted at this time. Spoke to [employer]. Was told pt was suitable for tissue and eye donor. Referral # [phone]

## 2021-01-16 NOTE — ED Notes (Signed)
CPR in progress upon arrival to ED.  1020 18 IV placed in RAC 1021 pulse check, no pulse, rhythm asystole, no cardiac activity on ultrasound 1022 epi given 1025 pulse check, rhythm asystole, no cardiac activity on ultra sound, no pulse 1026 time of death

## 2021-01-16 NOTE — Progress Notes (Signed)
   Jan 01, 2021 1022  Clinical Encounter Type  Visited With Family  Visit Type Death  Referral From Nurse  Consult/Referral To Chaplain   Chaplain responded to the level 1 page. This Chaplain accompanied Dr. Grandville Silos to inform the patient's mother, Kylie Robertson, of the patient's death. The Chaplain provided grief support to Ms.Kylie Robertson. Chaplain also escorted the patient's uncle, Kylie Robertson, to consultation rm A. This Chaplain provided and explained the patient placement card. Possibly due to shock, Ms. Kylie Robertson appeared to behave in a hurried motion to leave. She said she needed to inform the patient's husband and two children. This note was prepared by Jeanine Luz, M.Div..  For questions please contact by phone [phone].

## 2021-01-16 NOTE — ED Notes (Signed)
No belongings were removed from pt. All belongings pt had on her were transported w/ pt to morgue in body bag.

## 2021-01-16 NOTE — ED Notes (Addendum)
Trauma Response Nurse Note-  Reason for Call / Reason for Trauma activation:   - L1 trauma activation - cpr on arrival w/ head trauma  Initial Focused Assessment (If applicable, or please see trauma documentation):  - gcs 3  - lucas in place - asystole - cpr ongoing for 50 min - king airway in place and BVM - IO to L tib - IV to R AC  Interventions:  - [place] to assess cardiac activity - none present. - TOD 1026  Plan of Care as of this note:  - Bring family back to see patient - post-mortem care  Event Summary:   - Pt was found down outside of her car unresponsive.  Pt had a laceration to her forehead but it was unclear as to what happened.  CPR had been initiated and ongoing for 50 min prior to TRN arrival.  L1 wasn't called initially because of unclear mechanism and encode.  Pt was pronounced deceased at 67.  [place], [place]

## 2021-01-16 NOTE — ED Provider Notes (Signed)
[facility] EMERGENCY DEPARTMENT Provider Note   CSN: [id number] Arrival date & time:        History Chief Complaint  Patient presents with   Level 1 / CPR    Kylie Robertson is a 45 y.o. female.  HPI Patient is a 45 year old female with history of hypertension, presenting for cardiac arrest.  History is provided by both EMS and her mother.  EMS reports that they were called out for unresponsiveness.  She was found on the ground next to her car in the driveway of her mother's house.  She did have a wound to her forehead.  No pulses were present.  CPR was initiated.  On scene and during transit, patient received a total of 50 minutes of CPR, per ACLS protocol.  1.5 L of IV fluids was given.  10 doses of epinephrine were given.  On EMS twelve-lead monitor, patient was originally in asystole.  She did briefly have a rhythm that suggested ventricular fibrillation.  She did undergo 3 defibrillatory shocks.  She was subsequently in PEA and then asystole for the remainder of transit.  History per mother: This was completely unexpected.  Patient has had some abdominal discomfort and was recently seen by gastroenterology.  Patient has otherwise been in her normal state of health.  She took her kids trick-or-treating yesterday.  She was started on an antibiotic which mother believes was for gut flora.  This morning, patient brought her kids to her mother's house, which she does before work.  This was 8:45 AM.  Patient did mention her abdominal discomfort at that time.  Patient went out to her car and was sitting there, as she does when she has these abdominal discomfort episodes.  Another family member looked out the window and saw that the patient was on the ground next to her car.  She was unresponsive at this time.  At this point, EMS was called.    No past medical history on file.  There are no problems to display for this patient.      OB History   No obstetric  history on file.     No family history on file.     Home Medications Prior to Admission medications   Not on File    Allergies    Patient has no allergy information on record.  Review of Systems   Review of Systems  Unable to perform ROS: Patient unresponsive   Physical Exam Updated Vital Signs Pulse (!) 0   Resp (!) 0   Ht 5\' 6"  (1.676 m) Comment: estimated  Wt 113.4 kg Comment: estimated  BMI 40.35 kg/m   Physical Exam Constitutional:      Appearance: She is well-developed.  HENT:     Head:     Comments: Wound to left forehead.    Right Ear: External ear normal.     Left Ear: External ear normal.     Mouth/Throat:     Comments: King airway in place Eyes:     Comments: Pupils, 6 mm bilaterally, fixed and dilated  Cardiovascular:     Comments: No cardiac activity Pulmonary:     Comments: Ventilated with BVM.  Lung sounds present bilaterally Abdominal:     General: There is no distension.     Palpations: Abdomen is soft.  Musculoskeletal:     Right lower leg: No edema.     Left lower leg: No edema.  Neurological:     GCS: GCS eye  subscore is 1. GCS verbal subscore is 1. GCS motor subscore is 1.    ED Results / Procedures / Treatments   Labs (all labs ordered are listed, but only abnormal results are displayed) Labs Reviewed - No data to display  EKG EKG Interpretation  Date/Time:  01-06-2021 10:28:02 EDT Ventricular Rate:  0 PR Interval:    QRS Duration:   QT Interval:    QTC Calculation:   R Axis:   0 Text Interpretation: Uncertain rhythm: review No further analysis attempted - not enough leads could be measured Asystole Confirmed by Godfrey Pick ([id number]) on 06-Jan-2021 10:46:48 AM  Radiology No results found.  Procedures CPR  Date/Time: Jan 06, 2021 5:04 PM Performed by: Godfrey Pick, MD Authorized by: Godfrey Pick, MD  CPR Procedure Details:      Amount of time prior to administration of ACLS/BLS (minutes):  20   ACLS/BLS  initiated by EMS: Yes     CPR/ACLS performed in the ED: Yes     Duration of CPR (minutes):  60   Outcome: Pt declared dead    CPR performed via ACLS guidelines under my direct supervision.  See RN documentation for details including defibrillator use, medications, doses and timing.   Medications Ordered in ED Medications - No data to display  ED Course  I have reviewed the triage vital signs and the nursing notes.  Pertinent labs & imaging results that were available during my care of the patient were reviewed by me and considered in my medical decision making (see chart for details).    MDM Rules/Calculators/A&P                         CRITICAL CARE Performed by: Godfrey Pick   Total critical care time: 6 minutes  Critical care time was exclusive of separately billable procedures and treating other patients.  Critical care was necessary to treat or prevent imminent or life-threatening deterioration.  Critical care was time spent personally by me on the following activities: development of treatment plan with patient and/or surrogate as well as nursing, discussions with consultants, evaluation of patient's response to treatment, examination of patient, obtaining history from patient or surrogate, ordering and performing treatments and interventions, ordering and review of laboratory studies, ordering and review of radiographic studies, pulse oximetry and re-evaluation of patient's condition.   Patient is a 45 year old female with history of hypertension, who presented to the ED following a cardiac arrest.  On arrival in the ED, CPR was continued via [place] device.  Large tissue defect present in left forehead, suggestive of possible traumatic etiology.  For this reason, patient was made a level 1 trauma.  IV access was obtained and patient was given 1 mg of epinephrine.  On exam, patient's pupils are fixed and dilated.  Bedside ultrasound showed no cardiac activity on multiple pulse checks.   At this point, patient has been receiving CPR for approximately 60 minutes.  Time of death was declared at 10:26 AM.  Patient's family was informed.  They were able to provide recent history.  Based on history of family provides, patient likely had an event, while sitting in her car, that preceded her fall and forehead wound.  Patient's death was unexpected.  This was discussed with medical examiner who accepted the case.  Final Clinical Impression(s) / ED Diagnoses Final diagnoses:  Cardiac arrest ([place])  Open wound of forehead, initial encounter    Rx / DC Orders ED Discharge  Orders     None        Godfrey Pick, MD Jan 03, 2021 [phone]

## 2021-01-16 DEATH — deceased

## 2021-01-17 ENCOUNTER — Ambulatory Visit: Payer: Federal, State, Local not specified - PPO | Admitting: Internal Medicine

## 2021-02-04 ENCOUNTER — Telehealth: Payer: Self-pay | Admitting: Family Medicine

## 2021-02-04 ENCOUNTER — Telehealth: Payer: Self-pay

## 2021-02-04 NOTE — Telephone Encounter (Signed)
Pt mother call and stated she need the death certificate sign now.

## 2021-04-12 IMAGING — US US THYROID
1 series · 12 of 25 positions shown · non-contrast
Comparison: None.

CLINICAL DATA: Palpable abnormality. Palpable right-sided thyroid
nodule on physical examination.

EXAM:
THYROID ULTRASOUND
TECHNIQUE: Ultrasound examination of the thyroid gland and adjacent soft
tissues was performed.

[Series 1: us thyroid · 0.06mm/px · 12 of 50 slices shown]
[im 3/50]
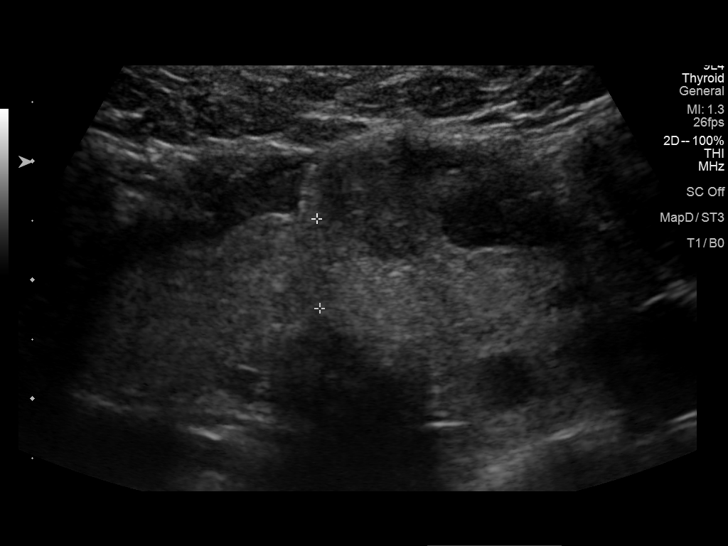
[im 7/50]
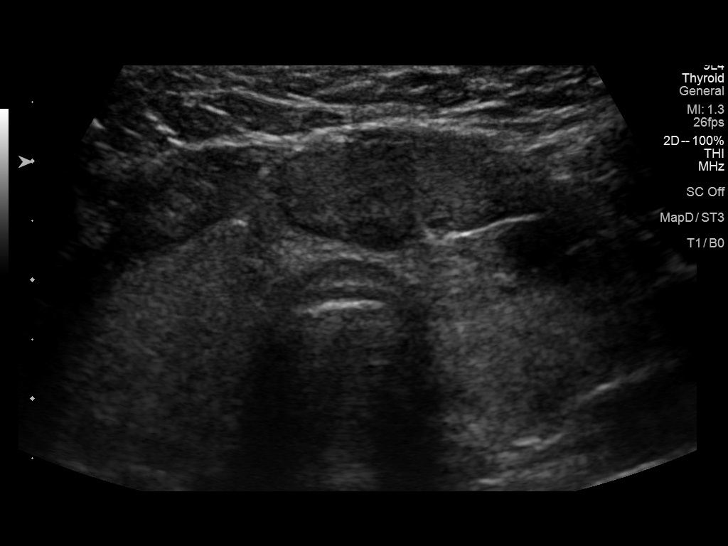
[im 11/50]
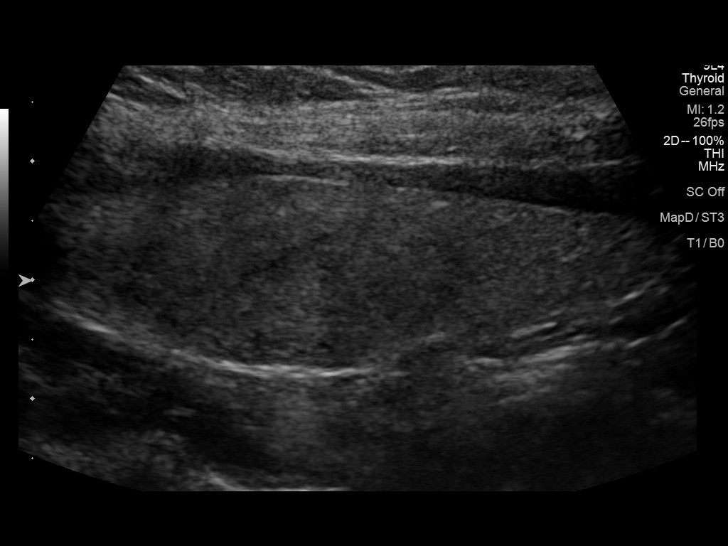
[im 15/50]
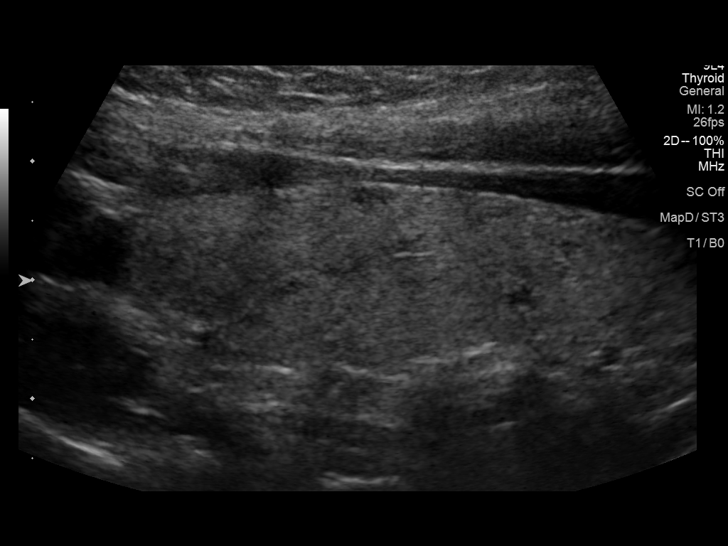
[im 19/50]
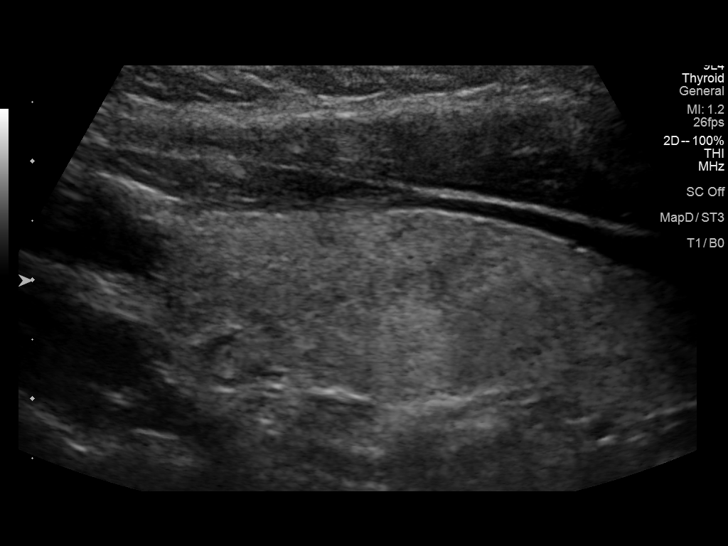
[im 23/50]
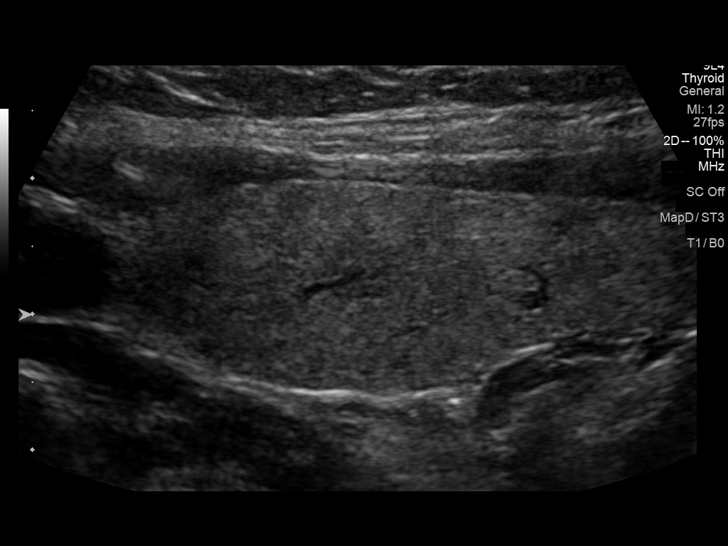
[im 27/50]
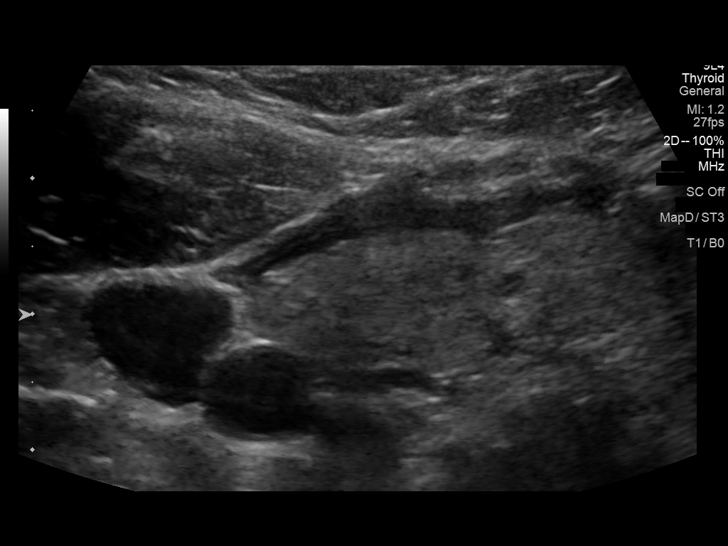
[im 31/50]
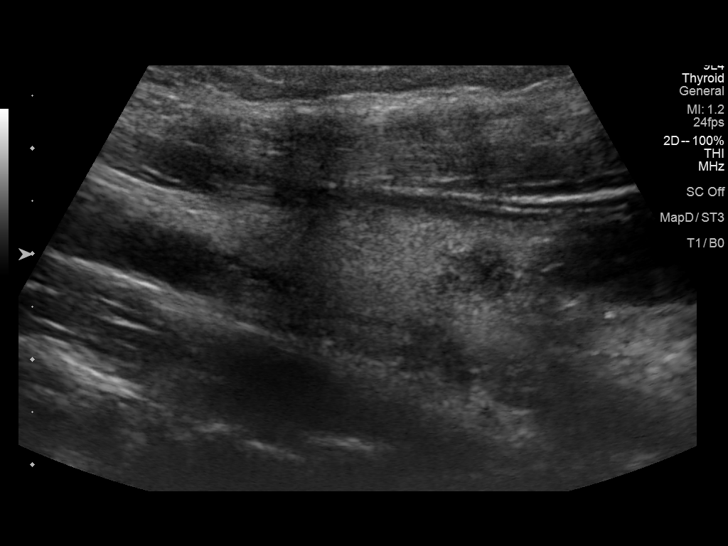
[im 35/50]
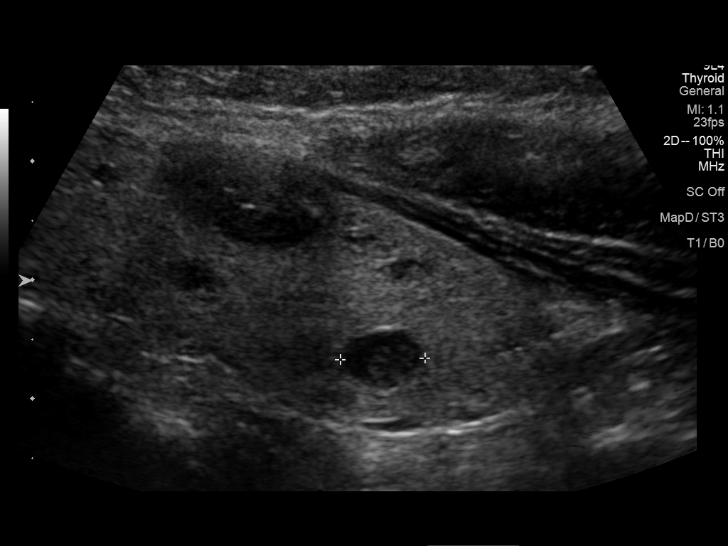
[im 39/50]
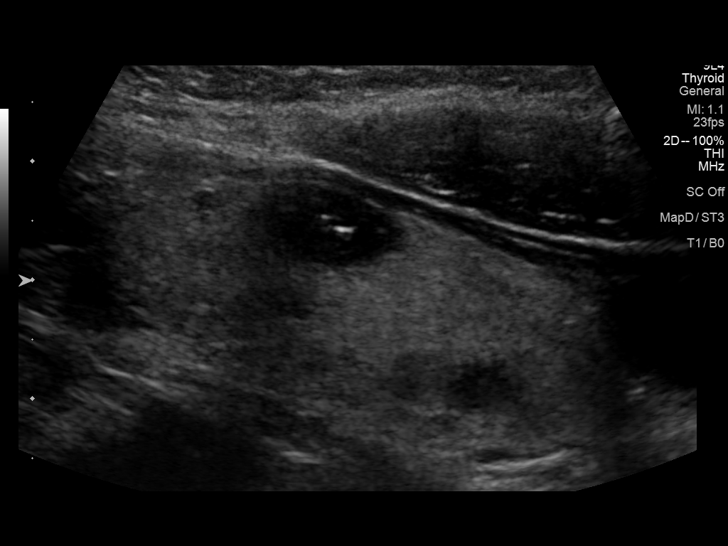
[im 43/50]
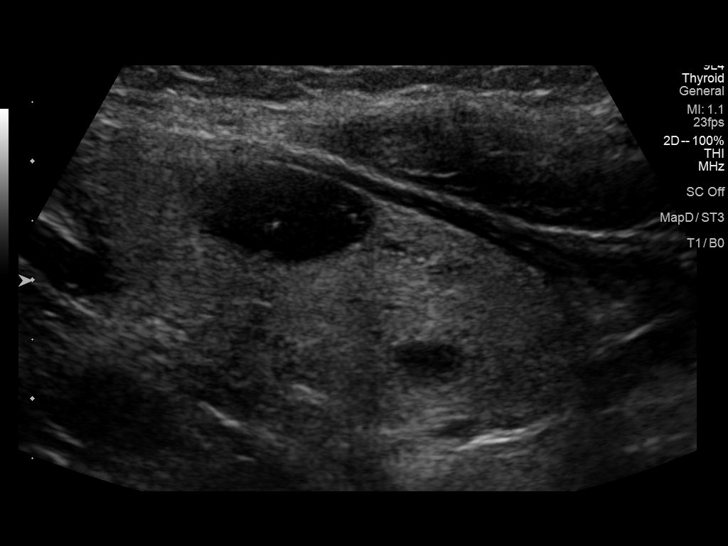
[im 47/50]
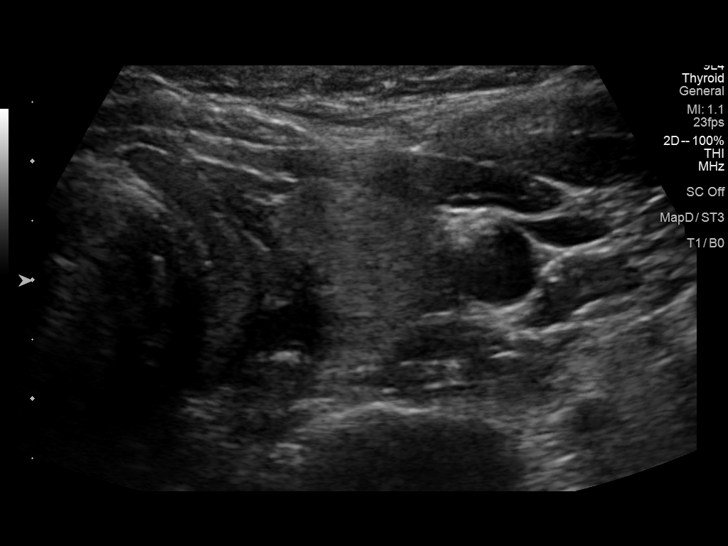

[12 of 25 positions shown; findings below may reference images not displayed]

FINDINGS: Parenchymal Echotexture: Mildly heterogenous

Isthmus: Borderline enlarged measuring 0.8 cm in diameter

Right lobe: Normal in size measuring 5.2 x 1.6 x 2.3 cm

Left lobe: There is normal in size measuring 5.0 x 1.9 x 2.0 cm

_________________________________________________________

Estimated total number of nodules >/= 1 cm: 2

Number of spongiform nodules >/=  2 cm not described below (TR1): 0

Number of mixed cystic and solid nodules >/= 1.5 cm not described
below (TR2): 0

_________________________________________________________

Nodule # 1:

Location: Isthmus; Mid

Maximum size: 1.8 cm; Other 2 dimensions: 1.2 x 0.9 cm

Composition: solid/almost completely solid (2)

Echogenicity: hypoechoic (2)

Shape: not taller-than-wide (0)

Margins: smooth (0)

Echogenic foci: none (0)

ACR TI-RADS total points: 4.

ACR TI-RADS risk category: TR4 (4-6 points).

ACR TI-RADS recommendations:

**Given size (>/= 1.5 cm) and appearance, fine needle aspiration of
this moderately suspicious nodule should be considered based on
TI-RADS criteria.

_________________________________________________________

There is an approximately 0.6 cm hypoechoic ill-defined
nodule/pseudonodule within the mid aspect the right lobe of the
thyroid (labeled 2), which does not meet criteria to recommend
percutaneous sampling or continued dedicated follow-up.

_________________________________________________________

There is an approximately 1.7 x 1.5 x 0.8 cm anechoic cyst within
the superior pole the left lobe of the thyroid (labeled 3), which
contains several internal echogenic foci ring down artifact
compatible with benign colloid. This benign colloid containing cyst
does not meet imaging criteria to recommend percutaneous sampling or
continued dedicated follow-up.

There is an approximately 0.6 cm anechoic cyst within the inferior
pole of the left lobe of the thyroid (labeled 4), which contains an
internal echogenic foci ring down artifact compatible with benign
colloid. This benign colloid containing cyst does not meet criteria
to recommend percutaneous sampling or continued dedicated follow-up.

There is an approximately 0.7 cm partially cystic, partially solid
nodule within the inferior pole the left lobe of the thyroid
(labeled 5), which does not meet criteria to recommend percutaneous
sampling or continued dedicated follow-up.

There is a punctate (approximately 0.6 cm) hypoechoic nodule within
the inferior pole of the left lobe of the thyroid (labeled 6), which
does not meet criteria to recommend percutaneous sampling or
continued dedicated follow-up.
IMPRESSION: 1. Findings suggestive of multinodular goiter.
2. Nodule #1 meets imaging criteria to recommend percutaneous
sampling as indicated.
3. None of the additional discretely measured thyroid nodules meet
imaging criteria to recommend percutaneous sampling or continued
dedicated follow-up.

The above is in keeping with the ACR TI-RADS recommendations - [HOSPITAL] 8146;[DATE].
